# Patient Record
Sex: Female | Born: 1995 | Race: White | Hispanic: No | Marital: Single | Smoking: Former smoker
Health system: Southern US, Community
[De-identification: ages and names within clinical notes are randomized; demographics above are authoritative.]

## PROBLEM LIST (undated history)

## (undated) DIAGNOSIS — F1911 Other psychoactive substance abuse, in remission: Secondary | ICD-10-CM

## (undated) DIAGNOSIS — F319 Bipolar disorder, unspecified: Secondary | ICD-10-CM

## (undated) DIAGNOSIS — D497 Neoplasm of unspecified behavior of endocrine glands and other parts of nervous system: Secondary | ICD-10-CM

## (undated) DIAGNOSIS — G931 Anoxic brain damage, not elsewhere classified: Secondary | ICD-10-CM

## (undated) DIAGNOSIS — F419 Anxiety disorder, unspecified: Secondary | ICD-10-CM

## (undated) DIAGNOSIS — R569 Unspecified convulsions: Secondary | ICD-10-CM

## (undated) DIAGNOSIS — F32A Depression, unspecified: Secondary | ICD-10-CM

## (undated) DIAGNOSIS — F329 Major depressive disorder, single episode, unspecified: Secondary | ICD-10-CM

## (undated) HISTORY — DX: Other psychoactive substance abuse, in remission: F19.11

## (undated) HISTORY — DX: Anxiety disorder, unspecified: F41.9

## (undated) HISTORY — PX: TONSILLECTOMY: SUR1361

---
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
## 2016-02-06 DIAGNOSIS — E229 Hyperfunction of pituitary gland, unspecified: Secondary | ICD-10-CM | POA: Insufficient documentation

## 2016-05-21 DIAGNOSIS — F603 Borderline personality disorder: Secondary | ICD-10-CM | POA: Insufficient documentation

## 2016-05-21 DIAGNOSIS — F0670 Mild neurocognitive disorder due to known physiological condition without behavioral disturbance: Secondary | ICD-10-CM | POA: Insufficient documentation

## 2016-12-14 ENCOUNTER — Emergency Department (EMERGENCY_DEPARTMENT_HOSPITAL)
Admission: EM | Admit: 2016-12-14 | Discharge: 2016-12-15 | Disposition: A | Discharge status: Other Acute Inpatient Hospital | Payer: No Typology Code available for payment source | Source: Home / Self Care | Attending: Emergency Medicine | Admitting: Emergency Medicine

## 2016-12-14 ENCOUNTER — Encounter (HOSPITAL_COMMUNITY): Payer: Self-pay | Admitting: Emergency Medicine

## 2016-12-14 ENCOUNTER — Other Ambulatory Visit: Payer: Self-pay

## 2016-12-14 ENCOUNTER — Emergency Department (HOSPITAL_COMMUNITY): Payer: No Typology Code available for payment source

## 2016-12-14 ENCOUNTER — Encounter (HOSPITAL_COMMUNITY): Payer: Self-pay

## 2016-12-14 ENCOUNTER — Emergency Department (HOSPITAL_COMMUNITY)
Admission: EM | Admit: 2016-12-14 | Discharge: 2016-12-14 | Disposition: A | Discharge status: Home / Self Care | Payer: No Typology Code available for payment source | Attending: Physician Assistant | Admitting: Physician Assistant

## 2016-12-14 DIAGNOSIS — F3181 Bipolar II disorder: Secondary | ICD-10-CM | POA: Diagnosis not present

## 2016-12-14 DIAGNOSIS — F488 Other specified nonpsychotic mental disorders: Secondary | ICD-10-CM | POA: Insufficient documentation

## 2016-12-14 DIAGNOSIS — Z79899 Other long term (current) drug therapy: Secondary | ICD-10-CM | POA: Insufficient documentation

## 2016-12-14 DIAGNOSIS — R4587 Impulsiveness: Secondary | ICD-10-CM | POA: Diagnosis not present

## 2016-12-14 DIAGNOSIS — F603 Borderline personality disorder: Secondary | ICD-10-CM | POA: Diagnosis not present

## 2016-12-14 DIAGNOSIS — F1220 Cannabis dependence, uncomplicated: Secondary | ICD-10-CM | POA: Diagnosis not present

## 2016-12-14 DIAGNOSIS — R55 Syncope and collapse: Secondary | ICD-10-CM | POA: Diagnosis present

## 2016-12-14 HISTORY — DX: Neoplasm of unspecified behavior of endocrine glands and other parts of nervous system: D49.7

## 2016-12-14 HISTORY — DX: Anoxic brain damage, not elsewhere classified: G93.1

## 2016-12-14 LAB — CBC WITH DIFFERENTIAL/PLATELET
Basophils Absolute: 0 10*3/uL (ref 0.0–0.1)
Basophils Relative: 0 %
Eosinophils Absolute: 0.4 10*3/uL (ref 0.0–0.7)
Eosinophils Relative: 6 %
HEMATOCRIT: 39 % (ref 36.0–46.0)
HEMOGLOBIN: 13.5 g/dL (ref 12.0–15.0)
LYMPHS ABS: 3.3 10*3/uL (ref 0.7–4.0)
Lymphocytes Relative: 41 %
MCH: 31.5 pg (ref 26.0–34.0)
MCHC: 34.6 g/dL (ref 30.0–36.0)
MCV: 90.9 fL (ref 78.0–100.0)
MONO ABS: 0.5 10*3/uL (ref 0.1–1.0)
MONOS PCT: 6 %
NEUTROS ABS: 3.8 10*3/uL (ref 1.7–7.7)
NEUTROS PCT: 47 %
Platelets: 223 10*3/uL (ref 150–400)
RBC: 4.29 MIL/uL (ref 3.87–5.11)
RDW: 12.8 % (ref 11.5–15.5)
WBC: 8 10*3/uL (ref 4.0–10.5)

## 2016-12-14 LAB — I-STAT BETA HCG BLOOD, ED (MC, WL, AP ONLY): I-stat hCG, quantitative: <5 m[IU]/mL (ref <5)

## 2016-12-14 LAB — COMPREHENSIVE METABOLIC PANEL
ALBUMIN: 3.8 g/dL (ref 3.5–5.0)
ALK PHOS: 58 U/L (ref 38–126)
ALT: 11 U/L — AB (ref 14–54)
AST: 21 U/L (ref 15–41)
Anion gap: 6 (ref 5–15)
BUN: 10 mg/dL (ref 6–20)
CALCIUM: 8.7 mg/dL — AB (ref 8.9–10.3)
CO2: 21 mmol/L — ABNORMAL LOW (ref 22–32)
CREATININE: 0.83 mg/dL (ref 0.44–1.00)
Chloride: 113 mmol/L — ABNORMAL HIGH (ref 101–111)
GFR calc Af Amer: >60 mL/min (ref >60)
GFR calc non Af Amer: >60 mL/min (ref >60)
GLUCOSE: 60 mg/dL — AB (ref 65–99)
Potassium: 3.9 mmol/L (ref 3.5–5.1)
Sodium: 140 mmol/L (ref 135–145)
Total Bilirubin: 0.3 mg/dL (ref 0.3–1.2)
Total Protein: 6.2 g/dL — ABNORMAL LOW (ref 6.5–8.1)

## 2016-12-14 LAB — CBG MONITORING, ED
GLUCOSE-CAPILLARY: 56 mg/dL — AB (ref 65–99)
GLUCOSE-CAPILLARY: 161 mg/dL — AB (ref 65–99)

## 2016-12-14 LAB — URINALYSIS, ROUTINE W REFLEX MICROSCOPIC
BILIRUBIN URINE: NEGATIVE
BILIRUBIN URINE: NEGATIVE
GLUCOSE, UA: NEGATIVE mg/dL
GLUCOSE, UA: NEGATIVE mg/dL
HGB URINE DIPSTICK: NEGATIVE
Hgb urine dipstick: NEGATIVE
Ketones, ur: NEGATIVE mg/dL
Ketones, ur: NEGATIVE mg/dL
Leukocytes, UA: NEGATIVE
Leukocytes, UA: NEGATIVE
Nitrite: NEGATIVE
Nitrite: NEGATIVE
PH: 6 (ref 5.0–8.0)
Protein, ur: NEGATIVE mg/dL
Protein, ur: NEGATIVE mg/dL
SPECIFIC GRAVITY, URINE: 1.008 (ref 1.005–1.030)
SPECIFIC GRAVITY, URINE: 1.004 — AB (ref 1.005–1.030)
pH: 6 (ref 5.0–8.0)

## 2016-12-14 LAB — RAPID URINE DRUG SCREEN, HOSP PERFORMED
AMPHETAMINES: NOT DETECTED
BARBITURATES: NOT DETECTED
BENZODIAZEPINES: NOT DETECTED
COCAINE: NOT DETECTED
Opiates: NOT DETECTED
TETRAHYDROCANNABINOL: POSITIVE — AB

## 2016-12-14 LAB — CBC
HCT: 39.8 % (ref 36.0–46.0)
Hemoglobin: 13.3 g/dL (ref 12.0–15.0)
MCH: 30.4 pg (ref 26.0–34.0)
MCHC: 33.4 g/dL (ref 30.0–36.0)
MCV: 91.1 fL (ref 78.0–100.0)
PLATELETS: 224 10*3/uL (ref 150–400)
RBC: 4.37 MIL/uL (ref 3.87–5.11)
RDW: 12.7 % (ref 11.5–15.5)
WBC: 9.8 10*3/uL (ref 4.0–10.5)

## 2016-12-14 LAB — ETHANOL: Alcohol, Ethyl (B): <10 mg/dL (ref <10)

## 2016-12-14 MED ORDER — LORAZEPAM 0.5 MG PO TABS
0.5000 mg | ORAL_TABLET | Freq: Four times a day (QID) | ORAL | Status: DC | PRN
Start: 2016-12-14 — End: 2016-12-14

## 2016-12-14 MED ORDER — LORAZEPAM 1 MG PO TABS: 0.5000 mg | ORAL_TABLET | Freq: Four times a day (QID) | ORAL | 0 refills | Status: DC | PRN

## 2016-12-14 MED ORDER — HYDROXYZINE HCL 25 MG PO TABS
25.0000 mg | ORAL_TABLET | Freq: Once | ORAL | Status: AC
  Administered 2016-12-14: 25 mg via ORAL
  Filled 2016-12-14: qty 1

## 2016-12-14 MED ORDER — CARIPRAZINE HCL 1.5 MG PO CAPS
4.5000 mg | ORAL_CAPSULE | Freq: Every day | ORAL | Status: DC
  Filled 2016-12-14: qty 3

## 2016-12-14 MED ORDER — ACETAMINOPHEN 325 MG PO TABS: 650.0000 mg | ORAL_TABLET | Freq: Four times a day (QID) | ORAL | Status: DC | PRN

## 2016-12-14 MED ORDER — LORAZEPAM 2 MG/ML IJ SOLN
1.0000 mg | Freq: Once | INTRAMUSCULAR | Status: AC
  Administered 2016-12-14: 1 mg via INTRAMUSCULAR
  Filled 2016-12-14: qty 1

## 2016-12-14 MED ORDER — SODIUM CHLORIDE 0.9 % IV BOLUS (SEPSIS)
1000.0000 mL | Freq: Once | INTRAVENOUS | Status: AC
  Administered 2016-12-14: 1000 mL via INTRAVENOUS

## 2016-12-14 MED ORDER — GABAPENTIN 100 MG PO CAPS: 200.0000 mg | ORAL_CAPSULE | Freq: Three times a day (TID) | ORAL | Status: DC | PRN

## 2016-12-14 MED ORDER — LORAZEPAM 1 MG PO TABS
1.0000 mg | ORAL_TABLET | Freq: Four times a day (QID) | ORAL | Status: DC | PRN
  Administered 2016-12-15: 1 mg via ORAL
  Filled 2016-12-14: qty 1
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
## 2016-12-14 NOTE — ED Notes (Signed)
Pt's friend states that she told her today that she was going to kill herself today   Pt continues to deny that she is suicidal  IVC paperwork in progress Pt yelling she does not need to be here and has not done anything wrong  Pt changed into scrubs but refuses to take out her piercings

## 2016-12-14 NOTE — ED Notes (Signed)
Pt. Transferred to [facility] from ED to room 34 after screening for contraband. Report to include Situation, Background, Assessment and Recommendations from [facility]. Pt. Oriented to unit including Q15 minute rounds as well as the security cameras for their protection. Patient is alert and oriented, warm and dry in no acute distress. Patient denies SI, HI, and AVH. Pt. Encouraged to let me know if needs arise.

## 2016-12-14 NOTE — ED Notes (Signed)
Mother to my desk stating that pt was having audible hallucinations earlier today  Mother requesting no changes made with her medications unless they speak with her psych doctor, Dr Dorann Ou, at [facility] in [place]

## 2016-12-14 NOTE — ED Notes (Signed)
Patient transported to CT 

## 2016-12-14 NOTE — ED Notes (Signed)
Pt. Requesting sleep med.

## 2016-12-14 NOTE — ED Notes (Signed)
TTS at bedside. 

## 2016-12-14 NOTE — ED Notes (Signed)
ED Provider at bedside. 

## 2016-12-14 NOTE — ED Provider Notes (Signed)
[facility] EMERGENCY DEPARTMENT Provider Note   CSN: [id number] Arrival date & time: 12/14/16  1549     History   Chief Complaint Chief Complaint  Patient presents with  . Loss of Consciousness    HPI Jenna Pratt is a 21 y.o. female.  HPI  Patient is a 21 year old female past medical history significant for depression, bipolar, pseudoseizures, and "a bunch of vasovagal's".  Patient is here with having several episodes of syncope and presyncope in the last couple days.  Patient reports yesterday at a restaurant she felt ill went to the bathroom and then passed out next the toilet.  Then today when she was leaving a restaurant she felt her vision go black and she was able to grab something and lower herself to theground.  Then on the car ride over she felt like she passed out for period of 2 minutes.  No unexplained early deaths in family members.  Patient has no symptoms such as shortness of breath, chest pain.    Past Medical History:  Diagnosis Date  . Anoxic brain injury ([place])    from previous OD   . Bipolar 1 disorder ([place])   . Depression   . Pituitary tumor   . Seizures ([place])     There are no active problems to display for this patient.   Past Surgical History:  Procedure Laterality Date  . TONSILLECTOMY      OB History    No data available       Home Medications    Prior to Admission medications   Medication Sig Start Date End Date Taking? Authorizing Provider  dicyclomine (BENTYL) 20 MG tablet Take 1 tablet (20 mg total) by mouth 3 (three) times daily as needed for spasms. 10/28/14   Daymon Larsen, MD    Family History History reviewed. No pertinent family history.  Social History Social History   Tobacco Use  . Smoking status: Never Smoker  . Smokeless tobacco: Never Used  Substance Use Topics  . Alcohol use: No    Frequency: Never  . Drug use: Yes    Types: Marijuana    Comment: Benzos     Allergies     Prozac [fluoxetine hcl]   Review of Systems Review of Systems  Constitutional: Positive for fatigue. Negative for activity change and fever.  Respiratory: Negative for shortness of breath.   Cardiovascular: Negative for chest pain.  Gastrointestinal: Negative for abdominal pain.  Neurological: Positive for syncope.     Physical Exam Updated Vital Signs BP 114/74   Pulse (!) 105   Temp 98.4 F (36.9 C) (Oral)   Resp 14   SpO2 100%   Physical Exam  Constitutional: She is oriented to person, place, and time. She appears well-developed and well-nourished.  HENT:  Head: Normocephalic and atraumatic.  Choker   Eyes: Right eye exhibits no discharge. Left eye exhibits no discharge.  Cardiovascular: Normal rate, regular rhythm and normal heart sounds.  No murmur heard. Pulmonary/Chest: Effort normal and breath sounds normal. She has no wheezes. She has no rales.  Abdominal: Soft. She exhibits no distension. There is no tenderness.  Neurological: She is oriented to person, place, and time.  Skin: Skin is warm and dry. She is not diaphoretic.  Psychiatric: She has a normal mood and affect.  Nursing note and vitals reviewed.    ED Treatments / Results  Labs (all labs ordered are listed, but only abnormal results are displayed) Labs Reviewed  CBG MONITORING, ED - Abnormal; Notable for the following components:      Result Value   Glucose-Capillary 56 (*)    All other components within normal limits  BASIC METABOLIC PANEL  CBC  URINALYSIS, ROUTINE W REFLEX MICROSCOPIC  COMPREHENSIVE METABOLIC PANEL  I-STAT BETA HCG BLOOD, ED ([facility], [facility], [facility] ONLY)  CBG MONITORING, ED    EKG  EKG Interpretation  Date/Time:  Sunday December 14 2016 16:00:30 EST Ventricular Rate:  101 PR Interval:  130 QRS Duration: 72 QT Interval:  342 QTC Calculation: 443 R Axis:   66 Text Interpretation:  Sinus tachycardia Otherwise normal ECG Sinus tachycardia Confirmed by Thomasene Lot, [place] ([id number])  on 12/14/2016 4:30:51 PM       Radiology No results found.  Procedures Procedures (including critical care time)  Medications Ordered in ED Medications  sodium chloride 0.9 % bolus 1,000 mL (not administered)     Initial Impression / Assessment and Plan / ED Course  I have reviewed the triage vital signs and the nursing notes.  Pertinent labs & imaging results that were available during my care of the patient were reviewed by me and considered in my medical decision making (see chart for details).    Patient is a 21 year old female past medical history significant for depression, bipolar, pseudoseizures, and "a bunch of vasovagal's".  Patient is here with having several episodes of syncope and presyncope in the last couple days.  Patient reports yesterday at a restaurant she felt ill went to the bathroom and then passed out next the toilet.  Then today when she was leaving a restaurant she felt her vision go black and she was able to grab something and lower herself to theground.  Then on the car ride over she felt like she passed out for period of 2 minutes.  No unexplained early deaths in family members.  Patient has no symptoms such as shortness of breath, chest pain.  4:43 PM Nothing about these episodes sounds cardiac in nature.  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  Patient of note is wearing a choker that is Nurse, mental health.  We recommend that she take it off in case of his vaso-occlusive.  She reports increased anxiety before each of these attacks.  We will give fluids, check baseline labs and imaging and likely be able to discharge home after ambulation.   Final Clinical Impressions(s) / ED Diagnoses   Final diagnoses:  None    ED Discharge Orders    None       Macarthur Critchley, MD 12/14/16 2202

## 2016-12-14 NOTE — ED Notes (Signed)
Pt given orange juice on the way back to treatment room

## 2016-12-14 NOTE — ED Triage Notes (Signed)
Onset yesterday LOC x 1, today x 3, last LOC 10 minutes ago.    Pt feels tired since first LOC.  No recent illness.

## 2016-12-14 NOTE — ED Provider Notes (Signed)
[place] DEPT Provider Note   CSN: [id number] Arrival date & time: 12/14/16  2208     History   Chief Complaint Chief Complaint  Patient presents with  . Suicidal    HPI LORANE COUSAR is a 21 y.o. female history of anoxic brain injury, bipolar, depression here presenting with suicidal ideations.  Patient was seen in the ED at home several hours ago for syncope and was thought to be pseudoseizures secondary to anxiety and depression.  She went home and apparently told her friend that she wanted to kill herself.  She has been depressed for many months and was in fact admitted to [facility] psychiatric center several months ago after an overdose.  He apparently called mother and became very physically aggressive with her and police was called and she was brought here for evaluation.  Patient adamantly denies any suicidal ideations to me but per the mother, patient always does that when she tries to avoid admissions.  While in the ED, patient became agitated and started to leave.   The history is provided by the patient and a parent.    Past Medical History:  Diagnosis Date  . Anoxic brain injury ([place])    from previous OD   . Bipolar 1 disorder ([place])   . Depression   . Pituitary tumor   . Seizures ([place])     There are no active problems to display for this patient.   Past Surgical History:  Procedure Laterality Date  . TONSILLECTOMY      OB History    No data available       Home Medications    Prior to Admission medications   Medication Sig Start Date End Date Taking? Authorizing Provider  Cariprazine HCl (VRAYLAR) 4.5 MG CAPS Take 4.5 mg at bedtime by mouth.     [provider]  dicyclomine (BENTYL) 20 MG tablet Take 1 tablet (20 mg total) by mouth 3 (three) times daily as needed for spasms. Patient not taking: Reported on 12/14/2016 10/28/14   Daymon Larsen, MD  ferrous sulfate 325 (65 FE) MG tablet Take 325 mg daily  by mouth.    [provider]  FOLIC ACID PO Take 1 tablet daily by mouth.    [provider]  gabapentin (NEURONTIN) 100 MG capsule Take 200 mg 3 (three) times daily as needed by mouth (anxiety/panic attacks).    [provider]  LORazepam (ATIVAN) 1 MG tablet Take 0.5 tablets (0.5 mg total) every 6 (six) hours as needed by mouth for anxiety. 12/14/16   Mackuen, Courteney Lyn, MD  venlafaxine (EFFEXOR) 25 MG tablet Take 25 mg daily by mouth.    [provider]    Family History History reviewed. No pertinent family history.  Social History Social History   Tobacco Use  . Smoking status: Never Smoker  . Smokeless tobacco: Never Used  Substance Use Topics  . Alcohol use: No    Frequency: Never  . Drug use: No    Comment: denies      Allergies   Prozac [fluoxetine hcl]   Review of Systems Review of Systems  Psychiatric/Behavioral: Positive for decreased concentration and suicidal ideas.  All other systems reviewed and are negative.    Physical Exam Updated Vital Signs BP 102/85 (BP Location: Left Arm)   Pulse (!) 102   Temp 98 F (36.7 C) (Oral)   Resp 18   SpO2 99%   Physical Exam  Constitutional: She  is oriented to person, place, and time.  Depressed, avoiding eye contact   HENT:  Head: Normocephalic.  Mouth/Throat: Oropharynx is clear and moist.  Eyes: Conjunctivae and EOM are normal. Pupils are equal, round, and reactive to light.  Neck: Normal range of motion. Neck supple.  Cardiovascular: Normal rate, regular rhythm and normal heart sounds.  Pulmonary/Chest: Effort normal and breath sounds normal. No stridor. No respiratory distress. She has no wheezes.  Abdominal: Soft. Bowel sounds are normal. She exhibits no distension. There is no tenderness. There is no guarding.  Musculoskeletal: Normal range of motion.  Neurological: She is alert and oriented to person, place, and time. She displays normal reflexes. No cranial nerve  deficit. Coordination normal.  Skin: Skin is warm.  Psychiatric:  Depressed, avoiding eye contact   Nursing note and vitals reviewed.    ED Treatments / Results  Labs (all labs ordered are listed, but only abnormal results are displayed) Labs Reviewed  CBC WITH DIFFERENTIAL/PLATELET  COMPREHENSIVE METABOLIC PANEL  ETHANOL  URINALYSIS, ROUTINE W REFLEX MICROSCOPIC  RAPID URINE DRUG SCREEN, HOSP PERFORMED  I-STAT BETA HCG BLOOD, ED ([facility], [facility], [facility] ONLY)    EKG  EKG Interpretation None       Radiology Dg Chest 2 View  Result Date: 12/14/2016 CLINICAL DATA:  Patient reports syncope for a few days. EXAM: CHEST  2 VIEW COMPARISON:  None. FINDINGS: The heart size and mediastinal contours are within normal limits. Both lungs are clear. The visualized skeletal structures are unremarkable. IMPRESSION: No active cardiopulmonary disease. Electronically Signed   By: Dorise Bullion III M.D   On: 12/14/2016 17:47   Ct Head Wo Contrast  Result Date: 12/14/2016 CLINICAL DATA:  Altered level of consciousness EXAM: CT HEAD WITHOUT CONTRAST TECHNIQUE: Contiguous axial images were obtained from the base of the skull through the vertex without intravenous contrast. COMPARISON:  None. FINDINGS: Brain: No evidence of acute infarction, hemorrhage, hydrocephalus, extra-axial collection or mass lesion/mass effect. Vascular: No hyperdense vessel or unexpected calcification. Skull: Normal. Negative for fracture or focal lesion. Sinuses/Orbits: No acute finding. Other: None. IMPRESSION: No acute abnormality noted. Electronically Signed   By: Inez Catalina M.D.   On: 12/14/2016 17:32    Procedures Procedures (including critical care time)  Medications Ordered in ED Medications  LORazepam (ATIVAN) injection 1 mg (1 mg Intramuscular Given 12/14/16 2309)     Initial Impression / Assessment and Plan / ED Course  I have reviewed the triage vital signs and the nursing notes.  Pertinent labs & imaging  results that were available during my care of the patient were reviewed by me and considered in my medical decision making (see chart for details).     Starlena Beil Dowdy is a 21 y.o. female here with depression, suicidal ideation. She was seen at [facility] several hours ago for syncope and was thought to have pseudoseizure or anxiety. She has been depressed and told friend and mother that she is suicidal. She doesn't want to stay for evaluation so IVC paperwork was filled out. Labs from earlier today reviewed and she denies any drug use since the ED visit. She is medically cleared. Will add ETOH, UDS. Will consult TTS.    Final Clinical Impressions(s) / ED Diagnoses   Final diagnoses:  None    ED Discharge Orders    None       Drenda Freeze, MD 12/14/16 2321

## 2016-12-14 NOTE — ED Notes (Signed)
Patient transported to X-ray 

## 2016-12-14 NOTE — ED Notes (Signed)
Bed: [facility]PT4 Expected date:  Expected time:  Means of arrival:  Comments: 

## 2016-12-14 NOTE — ED Triage Notes (Signed)
Pt brought in by sheriff from home  Mother called police to do a wellness check on her daughter  Pt states she had a syncopal episode today due to stress  Pt states she is fine  Mother states pt has a hx of depression and suicide attempts  Pt was in a coma due to an overdose in December and has had a couple attempts since then  Pt's brother died around this time of year  Pt denies suicidal ideations at this time  Pt came with sheriff voluntarily  States she came to avoid any altercations  Mother states she will IVC her if needed

## 2016-12-14 NOTE — BH Assessment (Signed)
[facility] Assessment Progress Note  Per Lindon Romp, NP pt meets criteria for inpt treatment. EDP Drenda Freeze, MD notified and in agreement with disposition. [facility] bed availability pending per [place]. Pt's nurse aware of recommendation.   Lind Covert, MSW, LCSW Therapeutic Triage Specialist  [phone]

## 2016-12-14 NOTE — Discharge Instructions (Signed)
We are glad you are feeling better.  Your CT, chest x-ray, labs are all reassuring.  Please follow-up with your primary care physician, you  may require Holter monitor or other evaluation.  To find a primary care or specialty doctor please call [phone] or [phone] to access "[place] a Doctor Service."  You may also go on the [facility] website at [email or web address]  There are also multiple [facility] practices throughout the [place] that are frequently accepting new patients. You may find a clinic that is close to your home and contact them.  [facility] -  [address] [place] [address] [phone]  [facility] in [place] (also locations in [place] and [place]) -  [place] [address]  [place] [address] [phone]

## 2016-12-15 ENCOUNTER — Other Ambulatory Visit: Payer: Self-pay

## 2016-12-15 ENCOUNTER — Inpatient Hospital Stay (HOSPITAL_COMMUNITY)
Admission: AD | Admit: 2016-12-15 | Discharge: 2016-12-20 | DRG: 885 | Disposition: A | Discharge status: Home / Self Care | Payer: No Typology Code available for payment source | Attending: Psychiatry | Admitting: Psychiatry

## 2016-12-15 ENCOUNTER — Encounter (HOSPITAL_COMMUNITY): Payer: Self-pay | Admitting: *Deleted

## 2016-12-15 DIAGNOSIS — R4587 Impulsiveness: Secondary | ICD-10-CM | POA: Diagnosis not present

## 2016-12-15 DIAGNOSIS — F419 Anxiety disorder, unspecified: Secondary | ICD-10-CM | POA: Diagnosis not present

## 2016-12-15 DIAGNOSIS — G931 Anoxic brain damage, not elsewhere classified: Secondary | ICD-10-CM | POA: Diagnosis present

## 2016-12-15 DIAGNOSIS — R569 Unspecified convulsions: Secondary | ICD-10-CM | POA: Diagnosis present

## 2016-12-15 DIAGNOSIS — F603 Borderline personality disorder: Secondary | ICD-10-CM | POA: Diagnosis not present

## 2016-12-15 DIAGNOSIS — F411 Generalized anxiety disorder: Secondary | ICD-10-CM | POA: Diagnosis present

## 2016-12-15 DIAGNOSIS — T6592XS Toxic effect of unspecified substance, intentional self-harm, sequela: Secondary | ICD-10-CM

## 2016-12-15 DIAGNOSIS — F3181 Bipolar II disorder: Secondary | ICD-10-CM | POA: Diagnosis present

## 2016-12-15 DIAGNOSIS — G4700 Insomnia, unspecified: Secondary | ICD-10-CM | POA: Diagnosis not present

## 2016-12-15 DIAGNOSIS — R450 Nervousness: Secondary | ICD-10-CM | POA: Diagnosis not present

## 2016-12-15 DIAGNOSIS — F488 Other specified nonpsychotic mental disorders: Secondary | ICD-10-CM | POA: Diagnosis not present

## 2016-12-15 DIAGNOSIS — F17210 Nicotine dependence, cigarettes, uncomplicated: Secondary | ICD-10-CM | POA: Diagnosis present

## 2016-12-15 DIAGNOSIS — Z818 Family history of other mental and behavioral disorders: Secondary | ICD-10-CM | POA: Diagnosis not present

## 2016-12-15 DIAGNOSIS — R45851 Suicidal ideations: Secondary | ICD-10-CM | POA: Diagnosis present

## 2016-12-15 DIAGNOSIS — F39 Unspecified mood [affective] disorder: Secondary | ICD-10-CM | POA: Diagnosis not present

## 2016-12-15 DIAGNOSIS — F1220 Cannabis dependence, uncomplicated: Secondary | ICD-10-CM

## 2016-12-15 LAB — COMPREHENSIVE METABOLIC PANEL
ALBUMIN: 4.2 g/dL (ref 3.5–5.0)
ALT: 10 U/L — ABNORMAL LOW (ref 14–54)
AST: 18 U/L (ref 15–41)
Alkaline Phosphatase: 52 U/L (ref 38–126)
Anion gap: 6 (ref 5–15)
BUN: 10 mg/dL (ref 6–20)
CHLORIDE: 111 mmol/L (ref 101–111)
CO2: 23 mmol/L (ref 22–32)
Calcium: 8.8 mg/dL — ABNORMAL LOW (ref 8.9–10.3)
Creatinine, Ser: 0.71 mg/dL (ref 0.44–1.00)
GFR calc Af Amer: >60 mL/min (ref >60)
GFR calc non Af Amer: >60 mL/min (ref >60)
GLUCOSE: 101 mg/dL — AB (ref 65–99)
POTASSIUM: 3.4 mmol/L — AB (ref 3.5–5.1)
Sodium: 140 mmol/L (ref 135–145)
Total Bilirubin: 0.5 mg/dL (ref 0.3–1.2)
Total Protein: 6.7 g/dL (ref 6.5–8.1)

## 2016-12-15 MED ORDER — HYDROXYZINE HCL 25 MG PO TABS
25.0000 mg | ORAL_TABLET | Freq: Three times a day (TID) | ORAL | Status: DC
Start: 2016-12-15 — End: 2016-12-15

## 2016-12-15 MED ORDER — MAGNESIUM HYDROXIDE 400 MG/5ML PO SUSP: 30.0000 mL | Freq: Every day | ORAL | Status: DC | PRN

## 2016-12-15 MED ORDER — CARIPRAZINE HCL 1.5 MG PO CAPS
4.5000 mg | ORAL_CAPSULE | Freq: Every day | ORAL | Status: DC
  Administered 2016-12-15 – 2016-12-19 (×4): 4.5 mg via ORAL
  Filled 2016-12-15 (×7): qty 3

## 2016-12-15 MED ORDER — HYDROXYZINE HCL 25 MG PO TABS: 25.0000 mg | ORAL_TABLET | Freq: Three times a day (TID) | ORAL | Status: DC

## 2016-12-15 MED ORDER — GABAPENTIN 100 MG PO CAPS
200.0000 mg | ORAL_CAPSULE | Freq: Two times a day (BID) | ORAL | Status: DC
  Administered 2016-12-15: 200 mg via ORAL
  Filled 2016-12-15: qty 2

## 2016-12-15 MED ORDER — ACETAMINOPHEN 325 MG PO TABS: 650.0000 mg | ORAL_TABLET | Freq: Four times a day (QID) | ORAL | Status: DC | PRN

## 2016-12-15 MED ORDER — TRAZODONE HCL 50 MG PO TABS
50.0000 mg | ORAL_TABLET | Freq: Every evening | ORAL | Status: DC | PRN
  Administered 2016-12-15 – 2016-12-19 (×4): 50 mg via ORAL
  Filled 2016-12-15 (×4): qty 1

## 2016-12-15 MED ORDER — LORAZEPAM 1 MG PO TABS
1.0000 mg | ORAL_TABLET | Freq: Four times a day (QID) | ORAL | Status: DC | PRN
  Administered 2016-12-15 – 2016-12-17 (×2): 1 mg via ORAL
  Filled 2016-12-15 (×3): qty 1

## 2016-12-15 MED ORDER — HYDROXYZINE HCL 25 MG PO TABS
25.0000 mg | ORAL_TABLET | Freq: Three times a day (TID) | ORAL | Status: DC
  Administered 2016-12-15 – 2016-12-20 (×15): 25 mg via ORAL
  Filled 2016-12-15 (×23): qty 1

## 2016-12-15 MED ORDER — GABAPENTIN 100 MG PO CAPS
200.0000 mg | ORAL_CAPSULE | Freq: Two times a day (BID) | ORAL | Status: DC
  Administered 2016-12-15 – 2016-12-16 (×2): 200 mg via ORAL
  Filled 2016-12-15 (×7): qty 2

## 2016-12-15 MED ORDER — ALUM & MAG HYDROXIDE-SIMETH 200-200-20 MG/5ML PO SUSP: 30.0000 mL | ORAL | Status: DC | PRN

## 2016-12-15 NOTE — Plan of Care (Signed)
Pt remains a low fall risk, denies SI at this time.

## 2016-12-15 NOTE — Consult Note (Signed)
[facility] Psychiatry Consult   Reason for Consult:  Depression and suicidal thoughts Referring Physician:  EDP Patient Identification: Jenna Pratt MRN:  [id number] Principal Diagnosis: Bipolar 2 disorder, major depressive episode [facility]) Diagnosis:   Patient Active Problem List   Diagnosis Date Noted  . Bipolar 2 disorder, major depressive episode ([place]) [F31.81] 12/15/2016    Priority: High  . Cannabis use disorder, moderate, dependence ([place]) [F12.20] 12/15/2016    Total Time spent with patient: 45 minutes  Subjective:   Jenna Pratt is a 21 y.o. female patient admitted with suicidal thoughts.  HPI:  Patient with history of Bipolar depression, Anxiety, Borderline personality disorder and Cannabis use disorder. Patient presents with worsening depression and recurrent suicidal thoughts. Patient reports being under a lot of stress, her brother dies around this time a year ago. Patient's mother took out IVC paper on her after patient apparently told her friend that she wanted to kill herself. She was combative and aggressive with the police prior to presentation at the ED. Patient has history of suicide attempt by OD.   Past Psychiatric History: as above  Risk to Self: Suicidal Ideation: Yes-Currently Present Suicidal Intent: No Is patient at risk for suicide?: Yes Suicidal Plan?: No Access to Means: No What has been your use of drugs/alcohol within the last 12 months?: denies use, labs are positive for cannabis on arrival to ED  How many times?: 1 Triggers for Past Attempts: Family contact Intentional Self Injurious Behavior: None Risk to Others: Homicidal Ideation: No Thoughts of Harm to Others: No Current Homicidal Intent: No Current Homicidal Plan: No Access to Homicidal Means: No History of harm to others?: No Assessment of Violence: None Noted Does patient have access to weapons?: No Criminal Charges Pending?: No Does patient have a court date: No Prior  Inpatient Therapy: Prior Inpatient Therapy: Yes Prior Therapy Dates: 2017 Prior Therapy Facilty/Provider(s): [facility] Reason for Treatment: attempted OD Prior Outpatient Therapy: Prior Outpatient Therapy: Yes Prior Therapy Dates: current Prior Therapy Facilty/Provider([facility]): [facility]  Reason for Treatment: med management  Does patient have an ACCT team?: No Does patient have Intensive In-House Services?  : No Does patient have [facility] services? : No Does patient have P4CC services?: No  Past Medical History:  Past Medical History:  Diagnosis Date  . Anoxic brain injury ([place])    from previous OD   . Bipolar 1 disorder ([place])   . Depression   . Pituitary tumor   . Seizures ([place])     Past Surgical History:  Procedure Laterality Date  . TONSILLECTOMY     Family History: History reviewed. No pertinent family history. Family Psychiatric  History:  Social History:  Social History   Substance and Sexual Activity  Alcohol Use No  . Frequency: Never     Social History   Substance and Sexual Activity  Drug Use No   Comment: denies     Social History   Socioeconomic History  . Marital status: Single    Spouse name: None  . Number of children: None  . Years of education: None  . Highest education level: None  Social Needs  . Financial resource strain: None  . Food insecurity - worry: None  . Food insecurity - inability: None  . Transportation needs - medical: None  . Transportation needs - non-medical: None  Occupational History  . None  Tobacco Use  . Smoking status: Never Smoker  . Smokeless tobacco: Never Used  Substance and Sexual Activity  .  Alcohol use: No    Frequency: Never  . Drug use: No    Comment: denies   . Sexual activity: Yes    Birth control/protection: None    Comment: pt is not sexually active with men  Other Topics Concern  . None  Social History Narrative  . None   Additional Social History:    Allergies:   Allergies  Allergen  Reactions  . Prozac [Fluoxetine Hcl] Anxiety    Labs:  Results for orders placed or performed during the hospital encounter of 12/14/16 (from the past 48 hour(s))  Urinalysis, Routine w reflex microscopic     Status: Abnormal   Collection Time: 12/14/16 10:50 PM  Result Value Ref Range   Color, Urine STRAW (A) YELLOW   APPearance CLEAR CLEAR   Specific Gravity, Urine 1.004 (L) 1.005 - 1.030   pH 6.0 5.0 - 8.0   Glucose, UA NEGATIVE NEGATIVE mg/dL   Hgb urine dipstick NEGATIVE NEGATIVE   Bilirubin Urine NEGATIVE NEGATIVE   Ketones, ur NEGATIVE NEGATIVE mg/dL   Protein, ur NEGATIVE NEGATIVE mg/dL   Nitrite NEGATIVE NEGATIVE   Leukocytes, UA NEGATIVE NEGATIVE  Rapid urine drug screen (hospital performed)     Status: Abnormal   Collection Time: 12/14/16 10:50 PM  Result Value Ref Range   Opiates NONE DETECTED NONE DETECTED   Cocaine NONE DETECTED NONE DETECTED   Benzodiazepines NONE DETECTED NONE DETECTED   Amphetamines NONE DETECTED NONE DETECTED   Tetrahydrocannabinol POSITIVE (A) NONE DETECTED   Barbiturates NONE DETECTED NONE DETECTED    Comment:        DRUG SCREEN FOR MEDICAL PURPOSES ONLY.  IF CONFIRMATION IS NEEDED FOR ANY PURPOSE, NOTIFY LAB WITHIN 5 DAYS.        LOWEST DETECTABLE LIMITS FOR URINE DRUG SCREEN Drug Class       Cutoff (ng/mL) Amphetamine      1000 Barbiturate      200 Benzodiazepine   637 Tricyclics       858 Opiates          300 Cocaine          300 THC              50   CBC with Differential/Platelet     Status: None   Collection Time: 12/14/16 11:01 PM  Result Value Ref Range   WBC 8.0 4.0 - 10.5 K/uL   RBC 4.29 3.87 - 5.11 MIL/uL   Hemoglobin 13.5 12.0 - 15.0 g/dL   HCT 39.0 36.0 - 46.0 %   MCV 90.9 78.0 - 100.0 fL   MCH 31.5 26.0 - 34.0 pg   MCHC 34.6 30.0 - 36.0 g/dL   RDW 12.8 11.5 - 15.5 %   Platelets 223 150 - 400 K/uL   Neutrophils Relative % 47 %   Neutro Abs 3.8 1.7 - 7.7 K/uL   Lymphocytes Relative 41 %   Lymphs Abs 3.3 0.7  - 4.0 K/uL   Monocytes Relative 6 %   Monocytes Absolute 0.5 0.1 - 1.0 K/uL   Eosinophils Relative 6 %   Eosinophils Absolute 0.4 0.0 - 0.7 K/uL   Basophils Relative 0 %   Basophils Absolute 0.0 0.0 - 0.1 K/uL  Comprehensive metabolic panel     Status: Abnormal   Collection Time: 12/14/16 11:01 PM  Result Value Ref Range   Sodium 140 135 - 145 mmol/L   Potassium 3.4 (L) 3.5 - 5.1 mmol/L   Chloride 111 101 - 111 mmol/L  CO2 23 22 - 32 mmol/L   Glucose, Bld 101 (H) 65 - 99 mg/dL   BUN 10 6 - 20 mg/dL   Creatinine, Ser 0.71 0.44 - 1.00 mg/dL   Calcium 8.8 (L) 8.9 - 10.3 mg/dL   Total Protein 6.7 6.5 - 8.1 g/dL   Albumin 4.2 3.5 - 5.0 g/dL   AST 18 15 - 41 U/L   ALT 10 (L) 14 - 54 U/L   Alkaline Phosphatase 52 38 - 126 U/L   Total Bilirubin 0.5 0.3 - 1.2 mg/dL   GFR calc non Af Amer >60 >60 mL/min   GFR calc Af Amer >60 >60 mL/min    Comment: (NOTE) The eGFR has been calculated using the CKD EPI equation. This calculation has not been validated in all clinical situations. eGFR's persistently <60 mL/min signify possible Chronic Kidney Disease.    Anion gap 6 5 - 15  Ethanol     Status: None   Collection Time: 12/14/16 11:01 PM  Result Value Ref Range   Alcohol, Ethyl (B) <10 <10 mg/dL    Comment:        LOWEST DETECTABLE LIMIT FOR SERUM ALCOHOL IS 10 mg/dL FOR MEDICAL PURPOSES ONLY   I-Stat Beta hCG blood, ED ([facility], [facility], [facility] only)     Status: None   Collection Time: 12/14/16 11:12 PM  Result Value Ref Range   I-stat hCG, quantitative <5.0 <5 mIU/mL   Comment 3            Comment:   GEST. AGE      CONC.  (mIU/mL)   <=1 WEEK        5 - 50     2 WEEKS       50 - 500     3 WEEKS       100 - 10,000     4 WEEKS     1,000 - 30,000        FEMALE AND NON-PREGNANT FEMALE:     LESS THAN 5 mIU/mL     Current Facility-Administered Medications  Medication Dose Route Frequency Provider Last Rate Last Dose  . acetaminophen (TYLENOL) tablet 650 mg  650 mg Oral Q6H PRN Drenda Freeze, MD      . Cariprazine HCl CAPS 4.5 mg  4.5 mg Oral QHS Drenda Freeze, MD      . gabapentin (NEURONTIN) capsule 200 mg  200 mg Oral TID PRN Drenda Freeze, MD      . LORazepam (ATIVAN) tablet 1 mg  1 mg Oral Q6H PRN Drenda Freeze, MD       Current Outpatient Medications  Medication Sig Dispense Refill  . Cariprazine HCl (VRAYLAR) 4.5 MG CAPS Take 4.5 mg at bedtime by mouth.     . dicyclomine (BENTYL) 20 MG tablet Take 1 tablet (20 mg total) by mouth 3 (three) times daily as needed for spasms. (Patient not taking: Reported on 12/14/2016) 30 tablet 0  . ferrous sulfate 325 (65 FE) MG tablet Take 325 mg daily by mouth.    . FOLIC ACID PO Take 1 tablet daily by mouth.    . gabapentin (NEURONTIN) 100 MG capsule Take 200 mg 3 (three) times daily as needed by mouth (anxiety/panic attacks).    . LORazepam (ATIVAN) 1 MG tablet Take 0.5 tablets (0.5 mg total) every 6 (six) hours as needed by mouth for anxiety. 3 tablet 0  . venlafaxine (EFFEXOR) 25 MG tablet Take 25 mg daily by  mouth.      Musculoskeletal: Strength & Muscle Tone: within normal limits Gait & Station: normal Patient leans: N/A  Psychiatric Specialty Exam: Physical Exam  Psychiatric: Her behavior is normal. Her affect is labile. Her speech is rapid and/or pressured. Cognition and memory are normal. She expresses impulsivity. She exhibits a depressed mood. She expresses suicidal ideation.    Review of Systems  Constitutional: Negative.   HENT: Negative.   Eyes: Negative.   Respiratory: Negative.   Cardiovascular: Negative.   Gastrointestinal: Negative.   Genitourinary: Negative.   Musculoskeletal: Negative.   Skin: Negative.   Neurological: Negative.   Endo/Heme/Allergies: Negative.   Psychiatric/Behavioral: Positive for depression and suicidal ideas. The patient is nervous/anxious and has insomnia.     Blood pressure (!) 102/56, pulse 72, temperature 98 F (36.7 C), temperature source Oral, resp. rate  16, SpO2 100 %.There is no height or weight on file to calculate BMI.  General Appearance: Casual  Eye Contact:  Minimal  Speech:  Pressured  Volume:  Decreased  Mood:  Depressed and Irritable  Affect:  Constricted  Thought Process:  Coherent  Orientation:  Full (Time, Place, and Person)  Thought Content:  Logical  Suicidal Thoughts:  Yes.  without intent/plan  Homicidal Thoughts:  No  Memory:  Immediate;   Good Recent;   Fair Remote;   Fair  Judgement:  Poor  Insight:  Shallow  Psychomotor Activity:  Psychomotor Retardation  Concentration:  Concentration: Fair and Attention Span: Fair  Recall:  [employer] of Knowledge:  Fair  Language:  Good  Akathisia:  No  Handed:  Right  AIMS (if indicated):     Assets:  Communication Skills Desire for Improvement Social Support  ADL's:  Intact  Cognition:  WNL  Sleep:   poor     Treatment Plan Summary: Daily contact with patient to assess and evaluate symptoms and progress in treatment and Medication management Continue Vraylar 4.5 mg Qhs, Reduce Gabapentin to 200 mg bid due to dizziness.  Disposition: Recommend psychiatric Inpatient admission when medically cleared.  Corena Pilgrim, MD 12/15/2016 11:23 AM

## 2016-12-15 NOTE — ED Notes (Signed)
Hourly rounding reveals patient sleeping in room. No complaints, stable, in no acute distress. Q15 minute rounds and monitoring via [employer] to continue. 

## 2016-12-15 NOTE — Progress Notes (Addendum)
  DATA ACTION RESPONSE  Objective- Pt. is visible in the hallway, seen interacting with visitor. Presents with an anxious/flat affect and mood. Pt states "I am having an anxiety attack right now". Pt was ask to go sit down and take deep breaths.Emotional support provided.    Subjective- Denies having any SI/HI/AVH/Pain at this time. Is cooperative and remain safe on the unit.  1:1 interaction in private to establish rapport. Encouragement, education, & support given from staff.  PRN Ativan and trazodone requested and will re-eval accordingly.   Safety maintained with Q 15 checks. Continue with POC.

## 2016-12-15 NOTE — Progress Notes (Signed)
Patient did not attend wrap up group tonight.

## 2016-12-15 NOTE — Progress Notes (Signed)
Jenna Pratt is 21 year old female pt admitted on involuntary basis. On admission, she endorses that she is here because she has been feeling suicidal. She denies currently and able to contract for safety while in the hospital. She reports that she has a psychiatrist and takes her medications as prescribed. She reports being hospitalized in the past for similar presentation. She reports financial and family stressors. She reports that she lives with her girlfriend and reports that she will go back there after discharge. She was oriented to the unit and safety maintained.

## 2016-12-15 NOTE — ED Notes (Signed)
Hourly rounding reveals patient in hall on phone. No complaints, stable, in no acute distress. Q15 minute rounds and monitoring via [employer] to continue.

## 2016-12-15 NOTE — ED Notes (Signed)
Pt discharged safely with [employer] .  Pt was calm and cooperative.  All belongings were returned to pt.

## 2016-12-15 NOTE — BH Assessment (Signed)
[place] Assessment Progress Note  Per Corena Pilgrim, MD, this pt requires psychiatric hospitalization.  Leonia Reader, RN, [facility] has assigned pt to [facility] Rm 405-2.  Pt presents under IVC initiated by EDP Shirlyn Goltz, MD, and IVC documents have been faxed to [facility].  Pt's nurse, Nena Jordan, has been notified, and agrees to call report to [phone].  Pt is to be transported via Event organiser.   Jalene Mullet, [place] Triage Specialist [phone]

## 2016-12-15 NOTE — ED Notes (Signed)
Pt is calm and cooperative.  Report called to [facility] and [employer] called for transport.  Pt denies S/I, H/I, and AVH.

## 2016-12-15 NOTE — Tx Team (Signed)
Initial Treatment Plan 12/15/2016 2:56 PM [name] Majka MGN:[id number]    PATIENT STRESSORS: Financial difficulties Marital or family conflict   PATIENT STRENGTHS: Ability for insight Average or above average intelligence Capable of independent living General fund of knowledge Motivation for treatment/growth   PATIENT IDENTIFIED PROBLEMS: Depression Suicidal thoughts "I want to work on my anxiety"                     DISCHARGE CRITERIA:  Ability to meet basic life and health needs Improved stabilization in mood, thinking, and/or behavior Reduction of life-threatening or endangering symptoms to within safe limits Verbal commitment to aftercare and medication compliance  PRELIMINARY DISCHARGE PLAN: Attend aftercare/continuing care group Return to previous living arrangement  PATIENT/FAMILY INVOLVEMENT: This treatment plan has been presented to and reviewed with the patient, Jenna Pratt, and/or family member, .  The patient and family have been given the opportunity to ask questions and make suggestions.  [place], [place], [place] 12/15/2016, 2:56 PM

## 2016-12-16 DIAGNOSIS — F3181 Bipolar II disorder: Principal | ICD-10-CM

## 2016-12-16 DIAGNOSIS — F17210 Nicotine dependence, cigarettes, uncomplicated: Secondary | ICD-10-CM

## 2016-12-16 DIAGNOSIS — Z818 Family history of other mental and behavioral disorders: Secondary | ICD-10-CM

## 2016-12-16 MED ORDER — GABAPENTIN 400 MG PO CAPS
400.0000 mg | ORAL_CAPSULE | Freq: Three times a day (TID) | ORAL | Status: DC
  Administered 2016-12-16 – 2016-12-20 (×12): 400 mg via ORAL
  Filled 2016-12-16 (×19): qty 1

## 2016-12-16 MED ORDER — DIVALPROEX SODIUM ER 500 MG PO TB24
1000.0000 mg | ORAL_TABLET | Freq: Every day | ORAL | Status: DC
  Administered 2016-12-16 – 2016-12-20 (×5): 1000 mg via ORAL
  Filled 2016-12-16 (×8): qty 2

## 2016-12-16 NOTE — Progress Notes (Signed)
D: Pt presents with a flat affect and depressed/anxious mood. Pt noted to have slow speech and interaction. Pt woke up this morning asking for anxiety medication. Pt didn't appear to be anxious but Probation officer administered pt her scheduled medications which included an antianxiety med. Pt denies SI/HI. A: Medications reviewed with pt. Medications administered as ordered per MD. Verbal support provided. Pt encouraged to attend groups. Pt encouraged to use coping skills to help cope with anxiety. 15 minute checks performed for safety. R: Pt safety maintained.

## 2016-12-16 NOTE — H&P (Signed)
Psychiatric Admission Assessment Adult  Patient Identification: Jenna Pratt MRN:  [id number] Date of Evaluation:  12/16/2016 Chief Complaint:  Reported to have expressed suicidal thoughts Principal Diagnosis: Bipolar Disorder Diagnosis:   Patient Active Problem List   Diagnosis Date Noted  . Bipolar 2 disorder, major depressive episode ([place]) [F31.81] 12/15/2016  . Cannabis use disorder, moderate, dependence ([place]) [F12.20] 12/15/2016   History of Present Illness:  21 y.o Caucasian female, lives with her girlfriend, employed. 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 Presented to the ER involuntarily. Her mom called the sherriff and requested a wellness check on her. Family is concerned as she had a serious OD last 2023-02-18. Her friend also reported that patient told her she was going to kill herself on that day. Of note, patient was earlier evaluated at the ER. She had passed out in the restroom of a restaurant. Routine labs electrolyte derangement.  UDS was positive for THC.  BAL<10 mg/dl  At interview, patient reports a history of mood disorder since her early teens. Says she has been diagnosed with bipolar disorder and borderline PD. Reports having mood swings. Says she feels anxious most of the time. Reports use of THC to calm her nerves. Says she struggles to sit still, she is not able to focus. Says she has been having frequent anxiety attacks. She passed out twice lately. Says she was having a bad day the day she came in here. She was worried about her repeated syncope and felt there was no point living anymore. Says she told her girlfriend she was going to die but she never had any intention or plan to end her own life. Says she scratched herself earlier at home. Patient odes not usually mutilate self. Says she was just overwhelmed. Says she has a pending assault and battery charge. She is due in court on 12/30/2016. Patient is currently on probation for driving undr  the influence of THC. Says she is not really stressed by anniversary of her brother's death. One passed in 02/17/2005 and the other 06-18-14. Both died accidentally.  Patient reports difficulty getting into sleep. Says she usually requires six pills of Melatonin or Benadryl to get into sleep. Her sleep is broken even with medication. Patient reports normal appetite. No abnormal perception. No persecutory delusion. No other form of delusion. She denies any current suicidal thoughts. No violent thoughts. No homicidal thoughts. She denies use of any synthetic drug. No other substance use. No access to weapons.   Total Time spent with patient: 1 hour  Past Psychiatric History: Bipolar Disorder. She has been tried on multiple agents in the past. Says she did not do well on Lithium. She was recently started on Cariprazine. She is currently on 4.5 mg daily. She is on Gabapentin for anxiety. Reports no benefit. Patient took a fatal OD in 02/18/2016. She was hospitalized at [facility]. Patient denies any previous suicidal behavior. She has been admitted at [facility] in the past.    Is the patient at risk to self? Yes.    Has the patient been a risk to self in the past 6 months? No.  Has the patient been a risk to self within the distant past? Yes.    Is the patient a risk to others? No.  Has the patient been a risk to others in the past 6 months? Yes.    Has the patient been a risk to others within the distant past? Yes.  Prior Inpatient Therapy:   Prior Outpatient Therapy:    Alcohol Screening: 1. How often do you have a drink containing alcohol?: Never 2. How many drinks containing alcohol do you have on a typical day when you are drinking?: 1 or 2 3. How often do you have six or more drinks on one occasion?: Never AUDIT-C Score: 0 4. How often during the last year have you found that you were not able to stop drinking once you had started?: Never 5. How often during the last year have  you failed to do what was normally expected from you becasue of drinking?: Never 6. How often during the last year have you needed a first drink in the morning to get yourself going after a heavy drinking session?: Never 7. How often during the last year have you had a feeling of guilt of remorse after drinking?: Never 8. How often during the last year have you been unable to remember what happened the night before because you had been drinking?: Never 9. Have you or someone else been injured as a result of your drinking?: No 10. Has a relative or friend or a doctor or another health worker been concerned about your drinking or suggested you cut down?: No Alcohol Use Disorder Identification Test Final Score (AUDIT): 0 Intervention/Follow-up: AUDIT Score <7 follow-up not indicated Substance Abuse History in the last 12 months:  Yes.   Consequences of Substance Abuse: As above  Previous Psychotropic Medications: Yes  Psychological Evaluations: Yes  Past Medical History:  Past Medical History:  Diagnosis Date  . Anoxic brain injury ([place])    from previous OD   . Bipolar 1 disorder ([place])   . Depression   . Pituitary tumor   . Seizures ([place])     Past Surgical History:  Procedure Laterality Date  . TONSILLECTOMY     Family History: History reviewed. No pertinent family history. Family Psychiatric  History: Mother suffers from anxiety. She is not sure of any other family history of mental illness. She is not aware of any family history of suicide.  Tobacco Screening: Have you used any form of tobacco in the last 30 days? (Cigarettes, Smokeless Tobacco, Cigars, and/or Pipes): Yes Tobacco use, Select all that apply: 4 or less cigarettes per day Are you interested in Tobacco Cessation Medications?: No, patient refused Counseled patient on smoking cessation including recognizing danger situations, developing coping skills and basic information about quitting provided: Refused/Declined practical  counseling Social History:  Social History   Substance and Sexual Activity  Alcohol Use No  . Frequency: Never     Social History   Substance and Sexual Activity  Drug Use No   Comment: denies     Additional Social History:                           Allergies:   Allergies  Allergen Reactions  . Prozac [Fluoxetine Hcl] Anxiety   Lab Results:  Results for orders placed or performed during the hospital encounter of 12/14/16 (from the past 48 hour(s))  Urinalysis, Routine w reflex microscopic     Status: Abnormal   Collection Time: 12/14/16 10:50 PM  Result Value Ref Range   Color, Urine STRAW (A) YELLOW   APPearance CLEAR CLEAR   Specific Gravity, Urine 1.004 (L) 1.005 - 1.030   pH 6.0 5.0 - 8.0   Glucose, UA NEGATIVE NEGATIVE mg/dL   Hgb urine dipstick NEGATIVE NEGATIVE  Bilirubin Urine NEGATIVE NEGATIVE   Ketones, ur NEGATIVE NEGATIVE mg/dL   Protein, ur NEGATIVE NEGATIVE mg/dL   Nitrite NEGATIVE NEGATIVE   Leukocytes, UA NEGATIVE NEGATIVE  Rapid urine drug screen (hospital performed)     Status: Abnormal   Collection Time: 12/14/16 10:50 PM  Result Value Ref Range   Opiates NONE DETECTED NONE DETECTED   Cocaine NONE DETECTED NONE DETECTED   Benzodiazepines NONE DETECTED NONE DETECTED   Amphetamines NONE DETECTED NONE DETECTED   Tetrahydrocannabinol POSITIVE (A) NONE DETECTED   Barbiturates NONE DETECTED NONE DETECTED    Comment:        DRUG SCREEN FOR MEDICAL PURPOSES ONLY.  IF CONFIRMATION IS NEEDED FOR ANY PURPOSE, NOTIFY LAB WITHIN 5 DAYS.        LOWEST DETECTABLE LIMITS FOR URINE DRUG SCREEN Drug Class       Cutoff (ng/mL) Amphetamine      1000 Barbiturate      200 Benzodiazepine   154 Tricyclics       008 Opiates          300 Cocaine          300 THC              50   CBC with Differential/Platelet     Status: None   Collection Time: 12/14/16 11:01 PM  Result Value Ref Range   WBC 8.0 4.0 - 10.5 K/uL   RBC 4.29 3.87 - 5.11 MIL/uL    Hemoglobin 13.5 12.0 - 15.0 g/dL   HCT 39.0 36.0 - 46.0 %   MCV 90.9 78.0 - 100.0 fL   MCH 31.5 26.0 - 34.0 pg   MCHC 34.6 30.0 - 36.0 g/dL   RDW 12.8 11.5 - 15.5 %   Platelets 223 150 - 400 K/uL   Neutrophils Relative % 47 %   Neutro Abs 3.8 1.7 - 7.7 K/uL   Lymphocytes Relative 41 %   Lymphs Abs 3.3 0.7 - 4.0 K/uL   Monocytes Relative 6 %   Monocytes Absolute 0.5 0.1 - 1.0 K/uL   Eosinophils Relative 6 %   Eosinophils Absolute 0.4 0.0 - 0.7 K/uL   Basophils Relative 0 %   Basophils Absolute 0.0 0.0 - 0.1 K/uL  Comprehensive metabolic panel     Status: Abnormal   Collection Time: 12/14/16 11:01 PM  Result Value Ref Range   Sodium 140 135 - 145 mmol/L   Potassium 3.4 (L) 3.5 - 5.1 mmol/L   Chloride 111 101 - 111 mmol/L   CO2 23 22 - 32 mmol/L   Glucose, Bld 101 (H) 65 - 99 mg/dL   BUN 10 6 - 20 mg/dL   Creatinine, Ser 0.71 0.44 - 1.00 mg/dL   Calcium 8.8 (L) 8.9 - 10.3 mg/dL   Total Protein 6.7 6.5 - 8.1 g/dL   Albumin 4.2 3.5 - 5.0 g/dL   AST 18 15 - 41 U/L   ALT 10 (L) 14 - 54 U/L   Alkaline Phosphatase 52 38 - 126 U/L   Total Bilirubin 0.5 0.3 - 1.2 mg/dL   GFR calc non Af Amer >60 >60 mL/min   GFR calc Af Amer >60 >60 mL/min    Comment: (NOTE) The eGFR has been calculated using the CKD EPI equation. This calculation has not been validated in all clinical situations. eGFR's persistently <60 mL/min signify possible Chronic Kidney Disease.    Anion gap 6 5 - 15  Ethanol     Status: None  Collection Time: 12/14/16 11:01 PM  Result Value Ref Range   Alcohol, Ethyl (B) <10 <10 mg/dL    Comment:        LOWEST DETECTABLE LIMIT FOR SERUM ALCOHOL IS 10 mg/dL FOR MEDICAL PURPOSES ONLY   I-Stat Beta hCG blood, ED ([facility], [facility], [facility] only)     Status: None   Collection Time: 12/14/16 11:12 PM  Result Value Ref Range   I-stat hCG, quantitative <5.0 <5 mIU/mL   Comment 3            Comment:   GEST. AGE      CONC.  (mIU/mL)   <=1 WEEK        5 - 50     2 WEEKS       50 - 500      3 WEEKS       100 - 10,000     4 WEEKS     1,000 - 30,000        FEMALE AND NON-PREGNANT FEMALE:     LESS THAN 5 mIU/mL     Blood Alcohol level:  Lab Results  Component Value Date   ETH <10 16/94/5038    Metabolic Disorder Labs:  No results found for: HGBA1C, MPG No results found for: PROLACTIN No results found for: CHOL, TRIG, HDL, CHOLHDL, VLDL, LDLCALC  Current Medications: Current Facility-Administered Medications  Medication Dose Route Frequency Provider Last Rate Last Dose  . acetaminophen (TYLENOL) tablet 650 mg  650 mg Oral Q6H PRN Ethelene Hal, NP      . alum & mag hydroxide-simeth (MAALOX/MYLANTA) 200-200-20 MG/5ML suspension 30 mL  30 mL Oral Q4H PRN Ethelene Hal, NP      . Cariprazine HCl CAPS 4.5 mg  4.5 mg Oral QHS Ethelene Hal, NP   4.5 mg at 12/15/16 2143  . gabapentin (NEURONTIN) capsule 200 mg  200 mg Oral BID Ethelene Hal, NP   200 mg at 12/16/16 8828  . hydrOXYzine (ATARAX/VISTARIL) tablet 25 mg  25 mg Oral TID Ethelene Hal, NP   25 mg at 12/16/16 1338  . LORazepam (ATIVAN) tablet 1 mg  1 mg Oral Q6H PRN Ethelene Hal, NP   1 mg at 12/15/16 1951  . magnesium hydroxide (MILK OF MAGNESIA) suspension 30 mL  30 mL Oral Daily PRN Ethelene Hal, NP      . traZODone (DESYREL) tablet 50 mg  50 mg Oral QHS PRN Ethelene Hal, NP   50 mg at 12/15/16 2143   PTA Medications: Medications Prior to Admission  Medication Sig Dispense Refill Last Dose  . Cariprazine HCl (VRAYLAR) 4.5 MG CAPS Take 4.5 mg at bedtime by mouth.    12/13/2016 at pm  . dicyclomine (BENTYL) 20 MG tablet Take 1 tablet (20 mg total) by mouth 3 (three) times daily as needed for spasms. (Patient not taking: Reported on 12/14/2016) 30 tablet 0 Not Taking at Unknown time  . ferrous sulfate 325 (65 FE) MG tablet Take 325 mg daily by mouth.   00/34/9179 at am  . FOLIC ACID PO Take 1 tablet daily by mouth.   12/13/2016 at Unknown time  .  gabapentin (NEURONTIN) 100 MG capsule Take 200 mg 3 (three) times daily as needed by mouth (anxiety/panic attacks).   12/14/2016 at noon  . LORazepam (ATIVAN) 1 MG tablet Take 0.5 tablets (0.5 mg total) every 6 (six) hours as needed by mouth for anxiety. 3 tablet 0   .  venlafaxine (EFFEXOR) 25 MG tablet Take 25 mg daily by mouth.   12/14/2016 at am    Musculoskeletal: Strength & Muscle Tone: within normal limits Gait & Station: normal Patient leans: N/A  Psychiatric Specialty Exam: Physical Exam  Constitutional: She appears well-developed and well-nourished.  HENT:  Head: Normocephalic and atraumatic.  Respiratory: Effort normal.  Neurological: She is alert.  Psychiatric:  As above    ROS  Blood pressure 99/70, pulse (!) 115, temperature 98.4 F (36.9 C), temperature source Oral, resp. rate 18, height 5' 2" (1.575 m), weight 46.7 kg (103 lb).Body mass index is 18.84 kg/m.  General Appearance:  Casually dressed, withdrawn, engaged well.   Eye Contact:  Good  Speech:  Soft spoken  Volume:  Decreased  Mood:  Anxious and Depressed  Affect:  Blunted  Thought Process:  Linear  Orientation:  Full (Time, Place, and Person)  Thought Content:  No delusional theme. No preoccupation with violent thoughts. No hallucination in any modality.   Suicidal Thoughts:  Has been having thoughts but denies any intent to act.   Homicidal Thoughts:  No  Memory:  Immediate;   Good Recent;   Fair Remote;   Good  Judgement:  Fair  Insight:  Good  Psychomotor Activity:  Decreased  Concentration:  Concentration: Fair and Attention Span: Fair  Recall:  Good  Fund of Knowledge:  Good  Language:  Good  Akathisia:  Negative  Handed:    AIMS (if indicated):     Assets:  Communication Skills Desire for Improvement Financial Resources/Insurance Housing Intimacy Resilience Transportation Vocational/Educational  ADL's:  Intact  Cognition:  WNL  Sleep:  Number of Hours: 6.75    Treatment Plan  Summary: Patient is currently presenting with a mixed affective state. Recent episode is perpetuated by pending legal issues and repeated syncope attacks. She mutilated self for the first time recently. She had expressed plans to end her life to her partner before being admitted. We discussed adding Depakote to her current regimen.  She consented to treatment after we reviewed the risks and benefits. We also plan to optimize Gabapentin.   Psychiatric: Bipolar Disorder ,,,, mixed state THC use disorder  Medical: Syncope   Psychosocial:  Legal issues  PLAN: 1. Continue Cariprazine 4.5 mg HS 2. Add Depakote ER 1000 mg daily 3. Increase Gabapentin to 400 mg TID 4. Encourage unit groups and activities 5. Monitor mood, behavior and interaction with peers 6. SW would gather collateral from her family 2. Continue IVC     Observation Level/Precautions:  15 minute checks  Laboratory:    Psychotherapy:    Medications:    Consultations:    Discharge Concerns:    Estimated LOS:  Other:     Physician Treatment Plan for Primary Diagnosis: <principal problem not specified> Long Term Goal(s): Improvement in symptoms so as ready for discharge  Short Term Goals: Ability to identify changes in lifestyle to reduce recurrence of condition will improve, Ability to verbalize feelings will improve, Ability to disclose and discuss suicidal ideas, Ability to demonstrate self-control will improve, Ability to identify and develop effective coping behaviors will improve, Ability to maintain clinical measurements within normal limits will improve, Compliance with prescribed medications will improve and Ability to identify triggers associated with substance abuse/mental health issues will improve  Physician Treatment Plan for Secondary Diagnosis: Active Problems:   Bipolar 2 disorder, major depressive episode ([place])  Long Term Goal(s): Improvement in symptoms so as ready for discharge  Short Term Goals:  Ability to identify changes in lifestyle to reduce recurrence of condition will improve, Ability to verbalize feelings will improve, Ability to disclose and discuss suicidal ideas, Ability to demonstrate self-control will improve, Ability to identify and develop effective coping behaviors will improve, Ability to maintain clinical measurements within normal limits will improve, Compliance with prescribed medications will improve and Ability to identify triggers associated with substance abuse/mental health issues will improve  I certify that inpatient services furnished can reasonably be expected to improve the patient's condition.    Artist Beach, MD 11/20/20182:35 PM

## 2016-12-16 NOTE — Progress Notes (Signed)
Recreation Therapy Notes  Animal-Assisted Activity (AAA) Program Checklist/Progress Notes Patient Eligibility Criteria Checklist & Daily Group note for Rec TxIntervention  Date: 11.20.2018 Time: 2:45pm Location: [address]   AAA/T Program Assumption of Risk Form signed by Patient/ or Parent Legal Guardian Yes  Patient is free of allergies or sever asthma Yes  Patient reports no fear of animals Yes  Patient reports no history of cruelty to animals Yes  Patient understands his/her participation is voluntary Yes  Patient washes hands before animal contact Yes  Patient washes hands after animal contact Yes  Behavioral Response: Appropriate   Education:Hand Washing, Appropriate Animal Interaction   Education Outcome: Acknowledges education.   Clinical Observations/Feedback: Patient attended session and interacted appropriately with therapy dog and peers.   Jenna Pratt, LRT/CTRS      Jenna Pratt L 12/16/2016 3:11 PM

## 2016-12-16 NOTE — BHH Group Notes (Signed)
LCSW Group Therapy Note  12/16/2016 1:15pm  Type of Therapy/Topic:  Group Therapy:  Balance in Life  Participation Level:  Active  Description of Group:    This group will address the concept of balance and how it feels and looks when one is unbalanced. Patients will be encouraged to process areas in their lives that are out of balance and identify reasons for remaining unbalanced. Facilitators will guide patients in utilizing problem-solving interventions to address and correct the stressor making their life unbalanced. Understanding and applying boundaries will be explored and addressed for obtaining and maintaining a balanced life. Patients will be encouraged to explore ways to assertively make their unbalanced needs known to significant others in their lives, using other group members and facilitator for support and feedback.  Therapeutic Goals: 1. Patient will identify two or more emotions or situations they have that consume much of in their lives. 2. Patient will identify signs/triggers that life has become out of balance:  3. Patient will identify two ways to set boundaries in order to achieve balance in their lives:  4. Patient will demonstrate ability to communicate their needs through discussion and/or role plays  Summary of Patient Progress:  Participated in group discussion of concept of balance, stressors that contribute to lack of balance and emotional feeling states when out of balance. Patient presented w flat affect, however when connecting w others in the group, she actively contributed and connected w others.  Was especially resonant with peer who described significant work stress.      Therapeutic Modalities:   Cognitive Behavioral Therapy Solution-Focused Therapy Assertiveness Training  Beverely Pace, [place] 12/16/2016 2:46 PM

## 2016-12-16 NOTE — BHH Suicide Risk Assessment (Signed)
[facility] Admission Suicide Risk Assessment   Nursing information obtained from:    Demographic factors:    Current Mental Status:    Loss Factors:    Historical Factors:    Risk Reduction Factors:     Total Time spent with patient: 30 minutes Principal Problem: Bipolar 2 disorder, major depressive episode ([place]) Diagnosis:   Patient Active Problem List   Diagnosis Date Noted  . Bipolar 2 disorder, major depressive episode ([place]) [F31.81] 12/15/2016  . Cannabis use disorder, moderate, dependence ([place]) [F12.20] 12/15/2016   Subjective Data:  21 y.o Caucasian female, lives with her girlfriend, employed. 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 Presented to the ER involuntarily. Her mom called the sherriff and requested a wellness check on her. Family is concerned as she had a serious OD last December. Her friend also reported that patient told her she was going to kill herself on that day. Of note, patient was earlier evaluated at the ER. She had passed out in the restroom of a restaurant. Routine labs electrolyte derangement.  UDS was positive for THC.  BAL<10 mg/dl. Patient is currently in a mixed state. She had previous suicidal attempt, no family history of suicide, no evidence of psychosis. No cognitive impairment. No access to weapons. she is cooperative with care. She has agreed to treatment recommendations. She has agreed to communicate suicidal thoughts of with staff if the thoughts becomes overwhelming.     Continued Clinical Symptoms:  Alcohol Use Disorder Identification Test Final Score (AUDIT): 0 The "Alcohol Use Disorders Identification Test", Guidelines for Use in Primary Care, Second Edition.  World Pharmacologist [facility]). Score between 0-7:  no or low risk or alcohol related problems. Score between 8-15:  moderate risk of alcohol related problems. Score between 16-19:  high risk of alcohol related problems. Score 20 or above:  warrants further diagnostic  evaluation for alcohol dependence and treatment.   CLINICAL FACTORS:   Bipolar Disorder:   Mixed State   Musculoskeletal: Strength & Muscle Tone: within normal limits Gait & Station: normal Patient leans: N/A  Psychiatric Specialty Exam: Physical Exam  ROS  Blood pressure 99/70, pulse (!) 115, temperature 98.4 F (36.9 C), temperature source Oral, resp. rate 18, height 5\' 2"  (1.575 m), weight 46.7 kg (103 lb).Body mass index is 18.84 kg/m.  General Appearance: As in H&P  Eye Contact:    Speech:    Volume:    Mood:    Affect:    Thought Process:    Orientation:    Thought Content:  As in H&P  Suicidal Thoughts:    Homicidal Thoughts:    Memory:    Judgement:    Insight:    Psychomotor Activity:    Concentration:  As in H&P  Recall:    Fund of Knowledge:    Language:    Akathisia:    Handed:    AIMS (if indicated):     Assets:    ADL's:    Cognition:  As in H&P  Sleep:  Number of Hours: 6.75      COGNITIVE FEATURES THAT CONTRIBUTE TO RISK:  None    SUICIDE RISK:   Moderate:  Frequent suicidal ideation with limited intensity, and duration, some specificity in terms of plans, no associated intent, good self-control, limited dysphoria/symptomatology, some risk factors present, and identifiable protective factors, including available and accessible social support.  PLAN OF CARE:  As in H&P'  I certify that inpatient services furnished can reasonably be expected  to improve the patient's condition.   Artist Beach, MD 12/16/2016, 3:40 PM

## 2016-12-17 DIAGNOSIS — G4700 Insomnia, unspecified: Secondary | ICD-10-CM

## 2016-12-17 DIAGNOSIS — F419 Anxiety disorder, unspecified: Secondary | ICD-10-CM

## 2016-12-17 DIAGNOSIS — F1220 Cannabis dependence, uncomplicated: Secondary | ICD-10-CM

## 2016-12-17 DIAGNOSIS — F39 Unspecified mood [affective] disorder: Secondary | ICD-10-CM

## 2016-12-17 NOTE — Social Work (Signed)
CSW called [facility], no current care coordinator assigned.  Per mother, Marya Landry [phone]) has been care coordinator in the past.  Left Vm requesting call back to see if patient can be referred for care coordination.  Edwyna Shell, LCSW Lead Clinical Social Worker Phone:  [phone]

## 2016-12-17 NOTE — Progress Notes (Signed)
[facility] MD Progress Note  12/17/2016 12:27 PM Jenna Pratt  MRN:  [id number]   Subjective:  Patient reports that she feels "zombieish", but no anxiety and her depression is at 2/10. She denies any SI/HI/AVH and contracts for safety. She has a apartment to stay at with her girlfriend, but their roommate was unhappy about the police coming there and has told them they need to move out. She states that this is not immediate and has a couple of months to work things out. She denies any medication side effects.  Objective: Patient is pleasant and cooperative. She appears flat and depressed. She has attended groups and interacted with peers and staff appropriately.   Principal Problem: Bipolar 2 disorder, major depressive episode ([place]) Diagnosis:   Patient Active Problem List   Diagnosis Date Noted  . Bipolar 2 disorder, major depressive episode ([place]) [F31.81] 12/15/2016  . Cannabis use disorder, moderate, dependence ([place]) [F12.20] 12/15/2016   Total Time spent with patient: 15 minutes  Past Psychiatric History: See H&P  Past Medical History:  Past Medical History:  Diagnosis Date  . Anoxic brain injury ([place])    from previous OD   . Bipolar 1 disorder ([place])   . Depression   . Pituitary tumor   . Seizures ([place])     Past Surgical History:  Procedure Laterality Date  . TONSILLECTOMY     Family History: History reviewed. No pertinent family history. Family Psychiatric  History: See H&P Social History:  Social History   Substance and Sexual Activity  Alcohol Use No  . Frequency: Never     Social History   Substance and Sexual Activity  Drug Use No   Comment: denies     Social History   Socioeconomic History  . Marital status: Single    Spouse name: None  . Number of children: None  . Years of education: None  . Highest education level: None  Social Needs  . Financial resource strain: None  . Food insecurity - worry: None  . Food insecurity - inability: None  .  Transportation needs - medical: None  . Transportation needs - non-medical: None  Occupational History  . None  Tobacco Use  . Smoking status: Current Some Day Smoker    Packs/day: 0.25    Types: Cigarettes  . Smokeless tobacco: Never Used  Substance and Sexual Activity  . Alcohol use: No    Frequency: Never  . Drug use: No    Comment: denies   . Sexual activity: Yes    Birth control/protection: None    Comment: pt is not sexually active with men  Other Topics Concern  . None  Social History Narrative  . None   Additional Social History:                         Sleep: Good  Appetite:  Good  Current Medications: Current Facility-Administered Medications  Medication Dose Route Frequency Provider Last Rate Last Dose  . acetaminophen (TYLENOL) tablet 650 mg  650 mg Oral Q6H PRN Ethelene Hal, NP      . alum & mag hydroxide-simeth (MAALOX/MYLANTA) 200-200-20 MG/5ML suspension 30 mL  30 mL Oral Q4H PRN Ethelene Hal, NP      . Cariprazine HCl CAPS 4.5 mg  4.5 mg Oral QHS Ethelene Hal, NP   4.5 mg at 12/15/16 2143  . divalproex (DEPAKOTE ER) 24 hr tablet 1,000 mg  1,000 mg Oral Daily Izediuno,  Laruth Bouchard, MD   1,000 mg at 12/17/16 0830  . gabapentin (NEURONTIN) capsule 400 mg  400 mg Oral TID Artist Beach, MD   400 mg at 12/17/16 0830  . hydrOXYzine (ATARAX/VISTARIL) tablet 25 mg  25 mg Oral TID Ethelene Hal, NP   25 mg at 12/17/16 0830  . LORazepam (ATIVAN) tablet 1 mg  1 mg Oral Q6H PRN Ethelene Hal, NP   1 mg at 12/15/16 1951  . magnesium hydroxide (MILK OF MAGNESIA) suspension 30 mL  30 mL Oral Daily PRN Ethelene Hal, NP      . traZODone (DESYREL) tablet 50 mg  50 mg Oral QHS PRN Ethelene Hal, NP   50 mg at 12/15/16 2143    Lab Results: No results found for this or any previous visit (from the past 48 hour(s)).  Blood Alcohol level:  Lab Results  Component Value Date   ETH <10 57/84/6962     Metabolic Disorder Labs: No results found for: HGBA1C, MPG No results found for: PROLACTIN No results found for: CHOL, TRIG, HDL, CHOLHDL, VLDL, LDLCALC  Physical Findings: AIMS: Facial and Oral Movements Muscles of Facial Expression: None, normal Lips and Perioral Area: None, normal Jaw: None, normal Tongue: None, normal,Extremity Movements Upper (arms, wrists, hands, fingers): None, normal Lower (legs, knees, ankles, toes): None, normal, Trunk Movements Neck, shoulders, hips: None, normal, Overall Severity Severity of abnormal movements (highest score from questions above): None, normal Incapacitation due to abnormal movements: None, normal Patient's awareness of abnormal movements (rate only patient's report): No Awareness, Dental Status Current problems with teeth and/or dentures?: No Does patient usually wear dentures?: No  CIWA:    COWS:     Musculoskeletal: Strength & Muscle Tone: within normal limits Gait & Station: normal Patient leans: N/A  Psychiatric Specialty Exam: Physical Exam  Nursing note and vitals reviewed. Constitutional: She is oriented to person, place, and time. She appears well-developed and well-nourished.  Cardiovascular: Normal rate.  Respiratory: Effort normal.  Musculoskeletal: Normal range of motion.  Neurological: She is alert and oriented to person, place, and time.  Skin: Skin is warm.    Review of Systems  Constitutional: Negative.   HENT: Negative.   Eyes: Negative.   Respiratory: Negative.   Cardiovascular: Negative.   Gastrointestinal: Negative.   Genitourinary: Negative.   Musculoskeletal: Negative.   Skin: Negative.   Neurological: Negative.   Endo/Heme/Allergies: Negative.   Psychiatric/Behavioral: Positive for depression (rates depression at 2/10). Negative for suicidal ideas. The patient is not nervous/anxious.     Blood pressure 115/82, pulse 85, temperature 97.8 F (36.6 C), temperature source Oral, resp. rate 16,  height 5\' 2"  (1.575 m), weight 46.7 kg (103 lb).Body mass index is 18.84 kg/m.  General Appearance: Casual  Eye Contact:  Good  Speech:  Clear and Coherent and Normal Rate  Volume:  Normal  Mood:  Depressed  Affect:  Flat  Thought Process:  Goal Directed and Descriptions of Associations: Intact  Orientation:  Full (Time, Place, and Person)  Thought Content:  WDL  Suicidal Thoughts:  No  Homicidal Thoughts:  No  Memory:  Immediate;   Good Recent;   Good Remote;   Good  Judgement:  Good  Insight:  Good  Psychomotor Activity:  Normal  Concentration:  Concentration: Good and Attention Span: Good  Recall:  Good  Fund of Knowledge:  Good  Language:  Good  Akathisia:  No  Handed:  Right  AIMS (if indicated):  Assets:  Communication Skills Desire for Improvement Financial Resources/Insurance Housing Physical Health Social Support Transportation  ADL's:  Intact  Cognition:  WNL  Sleep:  Number of Hours: 6.75   Problems Addressed: Bipolar II Cannabis abuse  Treatment Plan Summary: Daily contact with patient to assess and evaluate symptoms and progress in treatment, Medication management and Plan is to:  -Continue Cariprazine 4.5 mg PO QHS for mood stability -Continue Depakote 1000 mg PO QHS for mood stability -Continue Gabapentin 400 mg PO TID for mood stability -Continue Ativan 1 mg PO Q6H PRN for anxiety -Continue Vistaril 25 mg PO TID PRN for anxiety -Continue Trazodone 50 mg PO QHS PRN for insomnia -Encourage group therapy participation  Lewis Shock, FNP 12/17/2016, 12:27 PM

## 2016-12-17 NOTE — Progress Notes (Signed)
D: Patient denies SI, HI or AVH this evening.  During initial interaction with patient, she had a female visitor at which time she presented as flat but pleasant.  Patient presented after visitation to the medication window and was noted to be blunted with slow speech, stating that she "felt very tired and wanted to take her bedtime medication".  Pt. Then stated, "if I stay up for group it will look better right and they will let me leave".  Pt. Advised of scheduled medication availability at which time she stated, "I'm just going to go lay down, if I wake up I'll come and get the medication".    A: Patient given emotional support from RN. Patient encouraged to come to staff with concerns and/or questions. Patient's medication routine continued. Patient's orders and plan of care reviewed.   R: Patient remains appropriate and cooperative. Will continue to monitor patient q15 minutes for safety.

## 2016-12-17 NOTE — Social Work (Signed)
Mother concerned that patient is not able to communicate prior adverse reactions to many medications, will fax information on prior adverse reactions.  Edwyna Shell, LCSW Lead Clinical Social Worker Phone:  [phone]

## 2016-12-17 NOTE — [facility] Group Notes (Signed)
LCSW Group Therapy Note  12/17/2016 1:15pm  Type of Therapy and Topic:  Group Therapy:  Feelings around Relapse and Recovery  Participation Level: Active   Description of Group:    Patients in this group will discuss emotions they experience before and after a relapse. They will process how experiencing these feelings, or avoidance of experiencing them, relates to having a relapse. Facilitator will guide patients to explore emotions they have related to recovery. Patients will be encouraged to process which emotions are more powerful. They will be guided to discuss the emotional reaction significant others in their lives may have to their relapse or recovery. Patients will be assisted in exploring ways to respond to the emotions of others without this contributing to a relapse.  Therapeutic Goals: 1. Patient will identify two or more emotions that lead to a relapse for them 2. Patient will identify two emotions that result when they relapse 3. Patient will identify two emotions related to recovery 4. Patient will demonstrate ability to communicate their needs through discussion and/or role plays   Therapeutic Modalities:   Cognitive Behavioral Therapy Solution-Focused Therapy Assertiveness Training Relapse Prevention Therapy   Georga Kaufmann, MSW, [facility] 12/17/2016 3:55 PM

## 2016-12-17 NOTE — Progress Notes (Signed)
Patient ID: Jenna Pratt, female   DOB: July 31, 1995, 21 y.o.   MRN: [id number]  DAR: Pt. Denies SI/HI and A/V Hallucinations. She reports that her sleep last night was good, her appetite is good, her energy level is normal, and her concentration is good. She rates her depression level 0/10, her hopelessness level 0/10, and her anxiety level 4/10. Patient does not report any pain or discomfort at this time. Support and encouragement provided to the patient. Scheduled medications administered to patient per physician's orders. Patient is minimal with this Probation officer but cooperative. Her mother called this afternoon reporting feeling frustrated due to not knowing if her daughter, patient, was being discharged. Patient's mother also stated that patient was beginning to "panic" because she wants to be home. Writer attempted to speak with patient 1:1 about this to provide support however patient was guarded and did not respond to Probation officer. Q15 minute checks are maintained for safety.

## 2016-12-17 NOTE — Progress Notes (Signed)
Recreation Therapy Notes  Date: 12/17/16 Time: 0930 Location: [address] Dayroom  Group Topic: Stress Management  Goal Area(s) Addresses:  Patient will verbalize importance of using healthy stress management.  Patient will identify positive emotions associated with healthy stress management.   Behavioral Response: Engaged  Intervention: Stress Management  Activity :  Body Scan.  LRT introduced the stress management technique of guided body scan.  LRT played a body scan meditation from the Calm app that allowed patients to focus on whatever sensations they may have been feeling and what those sensations felt like to them.   Education:  Stress Management, Discharge Planning.   Education Outcome: Acknowledges edcuation/In group clarification offered/Needs additional education  Clinical Observations/Feedback: Pt attended group.   Victorino Sparrow, LRT/CTRS         Victorino Sparrow A 12/17/2016 12:24 PM

## 2016-12-17 NOTE — [facility] Suicide Risk Assessment (Signed)
[place] INPATIENT:  Family/Significant Other Suicide Prevention Education  Suicide Prevention Education:  Education Completed; Elisse Pennick, mother, [phone],  (name of family member/significant other) has been identified by the patient as the family member/significant other with whom the patient will be residing, and identified as the person(s) who will aid the patient in the event of a mental health crisis (suicidal ideations/suicide attempt).  With written consent from the patient, the family member/significant other has been provided the following suicide prevention education, prior to the and/or following the discharge of the patient.  The suicide prevention education provided includes the following:  Suicide risk factors  Suicide prevention and interventions  National Suicide Hotline telephone number  [facility] assessment telephone number  [facility] Emergency Assistance [place] and/or Residential Mobile Crisis Unit telephone number  Request made of family/significant other to:  Remove weapons (e.g., guns, rifles, knives), all items previously/currently identified as safety concern.    Remove drugs/medications (over-the-counter, prescriptions, illicit drugs), all items previously/currently identified as a safety concern.  The family member/significant other verbalizes understanding of the suicide prevention education information provided.  The family member/significant other agrees to remove the items of safety concern listed above.  Mother has spoken w patient, "she sounds really depressed still, voice sounds lethargic, slurry, sounds depressed to me.  When she's like that, she is not well."  "We expect this every year at this time, she is always extremely depressed from Nov - Feb.  Despite seeing she's is extremely depressed, it would add to her depression to spend Thanksgiving alone at the hospital."  Parents have been through years of struggle w  patient's depression, had prior suicide attempt which was very serious last year.  Seasonal affective disorder and holidays (brother died in car accident 5 years ago at this time, will not talk about her feelings, will not grieve) have been problem since age 85 (same time she was diagnosed w bipolar disorder).  As young child "she slept a lot Nov - Feb,, was put on Prozac for anxiety at age 76, became manic, every antidepressant since then has made her suicidal even when paired w mood stabilizers." Patient had therapist after death of older brother, "she just wont engage, the moment she is held accountable, she will walk out of the session and will not engage."  Mother aware that patient is now "homeless", stays w multiple friends "until she wears out her welcome because she is very aggressive/combative."  Has "charges for assault and battery against her dad and I."  Mother concerned she has returned to substance use, "she really cant be alone but she cant live w other people either."  Mother wants ACT team to help monitor medications, help w appointments.  Has anoxic brain injury from past suicide attempt, "looks like forgetfulness, short term memory loss, long term memory is intact."  "Medications are a mess, she really needs someone to help her w medications but she will not allow it, she cannot remember if she took medications and takes them again, or stops taking them."  Prior to admission, abruptly stopped taking medications.  Mother aware that she cannot force patient to take medications however she decompensates when not taking medications.  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  Has had tardive dyskensia and "severe tics" from some medications, "side effects are worse than what she already feels."  Mother  feels patient would do better in supportive living environment, has  been recommended neuropsychiatrist who did all her testing; however, patient has refused this option.  Mother has also wanted patient on LAI but prolactinoma has created medication dilemma.  "She is noncompliant in addition to have a lot of medication reactions."    Mother has spoken w Marya Landry, care coordinator for [facility], "problem is that she is floating around between [facility], [place] and [place]."  This has caused difficulty coordinating care for patient.    To mother's knowledge, patient does not have access to guns; however "her girlfriend collects [employer] and [place] has access to them." Patient has put all her medications together in one bottle "that's a plan, that's what she did last time."  "Her even having access to her medications is a danger to her, she should not have more than 7 days supply on her."  Mother has considered applying for guardianship given patient's impulsive actions and difficulty w handling herself, but thinks "this would make her hopeless and take away all her hope."    Beverely Pace 12/17/2016, 10:31 AM

## 2016-12-18 NOTE — Progress Notes (Signed)
Patient ID: Jenna Pratt, female   DOB: 1995-08-28, 21 y.o.   MRN: [id number]  D: Patient pleasant on approach tonight. Reports feeling anxious a lot. No active SI at this time. Contracts for safety on the unit. Did receive some ativan prn tonight and got some trazodone later for sleep. Patient wanting discharge and is hopeful about going tomorrow. A: Staff will continue to monitor on q 15 minute checks, follow-treatment plan, and give medications as prescribed.  R: Cooperative on the unit.

## 2016-12-18 NOTE — Progress Notes (Signed)
[facility] MD Progress Note  12/18/2016 9:04 PM Jenna Pratt  MRN:  [id number]   Subjective:  Patient reports that she is ready to go home and that she is fine and that nothing is wrong. She now says that if she is being evicted immediately and her girlfriend needs her help. She denese any SI/HI/AVH and contracts for safety. She continues asking to be discharged and is pressuring to be discharged. She does not want to discuss treatment and only discusses discharge.  Objective: Patient's chart and findings reviewed and discussed with treatment team. Patient appears depressed and has a flat affect. She became tearful and kept stating she was fine when informed she was not being discharged today. Discussed with her about the new medication and getting lab work done in the morning and that did not seem to matter and kept searching for ways to be discharged today.  Principal Problem: Bipolar 2 disorder, major depressive episode ([place]) Diagnosis:   Patient Active Problem List   Diagnosis Date Noted  . Bipolar 2 disorder, major depressive episode ([place]) [F31.81] 12/15/2016  . Cannabis use disorder, moderate, dependence ([place]) [F12.20] 12/15/2016   Total Time spent with patient: 25 minutes  Past Psychiatric History: See H&P  Past Medical History:  Past Medical History:  Diagnosis Date  . Anoxic brain injury ([place])    from previous OD   . Bipolar 1 disorder ([place])   . Depression   . Pituitary tumor   . Seizures ([place])     Past Surgical History:  Procedure Laterality Date  . TONSILLECTOMY     Family History: History reviewed. No pertinent family history. Family Psychiatric  History: See H&P Social History:  Social History   Substance and Sexual Activity  Alcohol Use No  . Frequency: Never     Social History   Substance and Sexual Activity  Drug Use No   Comment: denies     Social History   Socioeconomic History  . Marital status: Single    Spouse name: None  . Number of children:  None  . Years of education: None  . Highest education level: None  Social Needs  . Financial resource strain: None  . Food insecurity - worry: None  . Food insecurity - inability: None  . Transportation needs - medical: None  . Transportation needs - non-medical: None  Occupational History  . None  Tobacco Use  . Smoking status: Current Some Day Smoker    Packs/day: 0.25    Types: Cigarettes  . Smokeless tobacco: Never Used  Substance and Sexual Activity  . Alcohol use: No    Frequency: Never  . Drug use: No    Comment: denies   . Sexual activity: Yes    Birth control/protection: None    Comment: pt is not sexually active with men  Other Topics Concern  . None  Social History Narrative  . None   Additional Social History:                         Sleep: Good  Appetite:  Good  Current Medications: Current Facility-Administered Medications  Medication Dose Route Frequency Provider Last Rate Last Dose  . acetaminophen (TYLENOL) tablet 650 mg  650 mg Oral Q6H PRN Ethelene Hal, NP      . alum & mag hydroxide-simeth (MAALOX/MYLANTA) 200-200-20 MG/5ML suspension 30 mL  30 mL Oral Q4H PRN Ethelene Hal, NP      . Cariprazine HCl  CAPS 4.5 mg  4.5 mg Oral QHS Ethelene Hal, NP   4.5 mg at 12/17/16 2108  . divalproex (DEPAKOTE ER) 24 hr tablet 1,000 mg  1,000 mg Oral Daily Izediuno, Laruth Bouchard, MD   1,000 mg at 12/18/16 0809  . gabapentin (NEURONTIN) capsule 400 mg  400 mg Oral TID Artist Beach, MD   400 mg at 12/18/16 1718  . hydrOXYzine (ATARAX/VISTARIL) tablet 25 mg  25 mg Oral TID Ethelene Hal, NP   25 mg at 12/18/16 1718  . LORazepam (ATIVAN) tablet 1 mg  1 mg Oral Q6H PRN Ethelene Hal, NP   1 mg at 12/17/16 2110  . magnesium hydroxide (MILK OF MAGNESIA) suspension 30 mL  30 mL Oral Daily PRN Ethelene Hal, NP      . traZODone (DESYREL) tablet 50 mg  50 mg Oral QHS PRN Ethelene Hal, NP   50 mg at  12/17/16 2307    Lab Results: No results found for this or any previous visit (from the past 48 hour(s)).  Blood Alcohol level:  Lab Results  Component Value Date   ETH <10 73/71/0626    Metabolic Disorder Labs: No results found for: HGBA1C, MPG No results found for: PROLACTIN No results found for: CHOL, TRIG, HDL, CHOLHDL, VLDL, LDLCALC  Physical Findings: AIMS: Facial and Oral Movements Muscles of Facial Expression: None, normal Lips and Perioral Area: None, normal Jaw: None, normal Tongue: None, normal,Extremity Movements Upper (arms, wrists, hands, fingers): None, normal Lower (legs, knees, ankles, toes): None, normal, Trunk Movements Neck, shoulders, hips: None, normal, Overall Severity Severity of abnormal movements (highest score from questions above): None, normal Incapacitation due to abnormal movements: None, normal Patient's awareness of abnormal movements (rate only patient's report): No Awareness, Dental Status Current problems with teeth and/or dentures?: No Does patient usually wear dentures?: No  CIWA:    COWS:     Musculoskeletal: Strength & Muscle Tone: within normal limits Gait & Station: normal Patient leans: N/A  Psychiatric Specialty Exam: Physical Exam  Nursing note and vitals reviewed. Constitutional: She is oriented to person, place, and time. She appears well-developed and well-nourished.  Cardiovascular: Normal rate.  Respiratory: Effort normal.  Musculoskeletal: Normal range of motion.  Neurological: She is alert and oriented to person, place, and time.  Skin: Skin is warm.    Review of Systems  Constitutional: Negative.   HENT: Negative.   Eyes: Negative.   Respiratory: Negative.   Cardiovascular: Negative.   Gastrointestinal: Negative.   Genitourinary: Negative.   Musculoskeletal: Negative.   Skin: Negative.   Neurological: Negative.   Endo/Heme/Allergies: Negative.     Blood pressure 120/86, pulse 96, temperature 99.1 F  (37.3 C), temperature source Oral, resp. rate 16, height 5\' 2"  (1.575 m), weight 46.7 kg (103 lb).Body mass index is 18.84 kg/m.  General Appearance: Casual  Eye Contact:  Good  Speech:  Clear and Coherent and Normal Rate  Volume:  Normal  Mood:  Depressed  Affect:  Flat and Tearful  Thought Process:  Goal Directed and Descriptions of Associations: Intact  Orientation:  Full (Time, Place, and Person)  Thought Content:  WDL  Suicidal Thoughts:  No  Homicidal Thoughts:  No  Memory:  Immediate;   Good Recent;   Good Remote;   Good  Judgement:  Fair  Insight:  Fair  Psychomotor Activity:  Normal  Concentration:  Concentration: Good and Attention Span: Good  Recall:  Good  Fund of Knowledge:  Good  Language:  Good  Akathisia:  No  Handed:  Right  AIMS (if indicated):     Assets:  Communication Skills Desire for Improvement Financial Resources/Insurance Housing Physical Health Social Support Transportation  ADL's:  Intact  Cognition:  WNL  Sleep:  Number of Hours: 6.75   Problems Addressed: Bipolar II disorder  Treatment Plan Summary: Daily contact with patient to assess and evaluate symptoms and progress in treatment, Medication management and Plan is to:  -Continue Cariprazine 4.5 mg PO Daily for mood stability -Continue Depakote 1000 mg PO QHS for mood stability -Ordered Valproic Acid level for an AM draw -Continue Gabapentin 400 mg PO TID  -Continue Ativan 1 mg PO Q6H PRN for anxiety -Continue Vistaril 25 mg PO TID PRN for anxiety -Continue Trazodone 50 mg PO QHS PRN for insomnia -Encourage group therapy participation  Lewis Shock, FNP 12/18/2016, 9:04 PM

## 2016-12-18 NOTE — Plan of Care (Signed)
Pt safe on the unit, pt only focus is on D/c , pt has no insight into her Tx

## 2016-12-18 NOTE — Progress Notes (Signed)
Report received. Patient observed in bed asleep. Respirations even and non labored. No distress noted. Monitoring of patient continues. Patient remains safe on unit.

## 2016-12-18 NOTE — Progress Notes (Signed)
D: Pt denies SI/HI/AVH. Pt is pleasant and cooperative. Pt had minimal interaction on the unit. Pt appeared a little lethargic this evening. Pt stated " Just knock me out"   A: Pt was offered support and encouragement. Pt was given scheduled medications. Pt was encourage to attend groups. Q 15 minute checks were done for safety.   R: Pt is taking medication.  safety maintained on unit.

## 2016-12-18 NOTE — Progress Notes (Signed)
Pt attended evening wrap up group stated that today was a 10 for her she was able to see her girlfriend and her parents for the holiday

## 2016-12-18 NOTE — Progress Notes (Signed)
Patient ID: Jenna Pratt, female   DOB: Feb 27, 1995, 21 y.o.   MRN: [id number]  DAR: Pt. Denies SI/HI and A/V Hallucinations. She reports that her sleep last night was god, her appetite is good, her energy level is normal, and concentration is good. She rates her depression, hopelessness, and anxiety level 0/10. Patient does not report any pain or discomfort at this time. Support and encouragement provided to the patient. Scheduled medications administered to patient per physician's orders. Patient is minimal with Probation officer but cooperative. She is seen this morning with tears in her eyes. Patient was upset due to not discharging today. Patient appears depressed in mood this morning. This afternoon patient's partner visited her. Q15 minute checks are maintained for safety.

## 2016-12-19 LAB — VALPROIC ACID LEVEL: VALPROIC ACID LVL: 85 ug/mL (ref 50.0–100.0)
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
## 2016-12-19 NOTE — Progress Notes (Signed)
[facility] MD Progress Note  12/19/2016 9:58 AM Jenna Pratt  MRN:  [id number] Subjective:   21 y.o Caucasian female, lives with her girlfriend, employed. 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 Presented to the ER involuntarily. Her mom called the sherriff and requested a wellness check on her. Family is concerned as she had a serious OD last December. Her girlfriend also reported that patient told her she was going to kill herself on that day. Of note, patient was earlier evaluated at the ER. She had passed out in the restroom of a restaurant. Routine labs electrolyte derangement.  UDS was positive for THC.  BAL<10 mg/dl  Chart reviewed today. Patient discussed at team today.  Staff reports that she has been focused on being discharged. She has been dismissive of being suicidal. Gets emotional when discharge is not offered. Warms up with time. She has been socializing with peers. She has been taking her medications as prescribed.  No PRN medication lately.  No behavioral issues. She has not been observed to be internally stimulated.   Seen today. Patient says she feels good on her medication. No side effects from Depakote. She is therapeutic. Patient shared that she had not been adherent with her medications at home. Says she was feeling down and overwhelmed. Says now she is back on her medications, she is feeling much better. Says she is now sleeping well. She is no longer feeling irritable. Says suicidal thoughts has resolved. Her family came to visit her yesterday. Says she enjoyed being with them. Patient says she and her girlfriend have to move to another apartment. This was anticipated and they have money saved for it. Patient is not overwhelmed by it. No new stressors. No violent thoughts. No abnormal perception.   I spoke to her mom Jenna Pratt on [phone]. She reports that patient looks much better now she is back on her medication. Says patient usually is not adherent. She  tends to forget taking her medications. Substance use without the medications triggers aggressive outburst. Mom is considering guardianship so patient can be on LAI. Mom notes that she is back to baseline and she is pleased with discharge plans for tomorrow.   Principal Problem: Bipolar 2 disorder, major depressive episode ([place]) Diagnosis:   Patient Active Problem List   Diagnosis Date Noted  . Bipolar 2 disorder, major depressive episode ([place]) [F31.81] 12/15/2016  . Cannabis use disorder, moderate, dependence ([place]) [F12.20] 12/15/2016   Total Time spent with patient: 30 minutes  Past Psychiatric History: As in H&P  Past Medical History:  Past Medical History:  Diagnosis Date  . Anoxic brain injury ([place])    from previous OD   . Bipolar 1 disorder ([place])   . Depression   . Pituitary tumor   . Seizures ([place])     Past Surgical History:  Procedure Laterality Date  . TONSILLECTOMY     Family History: History reviewed. No pertinent family history. Family Psychiatric  History: As in H&P Social History:  Social History   Substance and Sexual Activity  Alcohol Use No  . Frequency: Never     Social History   Substance and Sexual Activity  Drug Use No   Comment: denies     Social History   Socioeconomic History  . Marital status: Single    Spouse name: None  . Number of children: None  . Years of education: None  . Highest education level: None  Social Needs  . Financial resource strain: None  .  Food insecurity - worry: None  . Food insecurity - inability: None  . Transportation needs - medical: None  . Transportation needs - non-medical: None  Occupational History  . None  Tobacco Use  . Smoking status: Current Some Day Smoker    Packs/day: 0.25    Types: Cigarettes  . Smokeless tobacco: Never Used  Substance and Sexual Activity  . Alcohol use: No    Frequency: Never  . Drug use: No    Comment: denies   . Sexual activity: Yes    Birth control/protection: None     Comment: pt is not sexually active with men  Other Topics Concern  . None  Social History Narrative  . None   Additional Social History:      Sleep: Good  Appetite:  Good  Current Medications: Current Facility-Administered Medications  Medication Dose Route Frequency Provider Last Rate Last Dose  . acetaminophen (TYLENOL) tablet 650 mg  650 mg Oral Q6H PRN Ethelene Hal, NP      . alum & mag hydroxide-simeth (MAALOX/MYLANTA) 200-200-20 MG/5ML suspension 30 mL  30 mL Oral Q4H PRN Ethelene Hal, NP      . Cariprazine HCl CAPS 4.5 mg  4.5 mg Oral QHS Ethelene Hal, NP   4.5 mg at 12/18/16 2106  . divalproex (DEPAKOTE ER) 24 hr tablet 1,000 mg  1,000 mg Oral Daily Izediuno, Laruth Bouchard, MD   1,000 mg at 12/19/16 0825  . gabapentin (NEURONTIN) capsule 400 mg  400 mg Oral TID Artist Beach, MD   400 mg at 12/19/16 0825  . hydrOXYzine (ATARAX/VISTARIL) tablet 25 mg  25 mg Oral TID Ethelene Hal, NP   25 mg at 12/19/16 0825  . LORazepam (ATIVAN) tablet 1 mg  1 mg Oral Q6H PRN Ethelene Hal, NP   1 mg at 12/17/16 2110  . magnesium hydroxide (MILK OF MAGNESIA) suspension 30 mL  30 mL Oral Daily PRN Ethelene Hal, NP      . traZODone (DESYREL) tablet 50 mg  50 mg Oral QHS PRN Ethelene Hal, NP   50 mg at 12/18/16 2106    Lab Results:  Results for orders placed or performed during the hospital encounter of 12/15/16 (from the past 48 hour(s))  Valproic acid level     Status: None   Collection Time: 12/19/16  6:08 AM  Result Value Ref Range   Valproic Acid Lvl 85 50.0 - 100.0 ug/mL    Comment: Performed at [facility], [place] [address], [place], [place] [address]    Blood Alcohol level:  Lab Results  Component Value Date   ETH <10 23/55/7322    Metabolic Disorder Labs: No results found for: HGBA1C, MPG No results found for: PROLACTIN No results found for: CHOL, TRIG, HDL, CHOLHDL, VLDL,  LDLCALC  Physical Findings: AIMS: Facial and Oral Movements Muscles of Facial Expression: None, normal Lips and Perioral Area: None, normal Jaw: None, normal Tongue: None, normal,Extremity Movements Upper (arms, wrists, hands, fingers): None, normal Lower (legs, knees, ankles, toes): None, normal, Trunk Movements Neck, shoulders, hips: None, normal, Overall Severity Severity of abnormal movements (highest score from questions above): None, normal Incapacitation due to abnormal movements: None, normal Patient's awareness of abnormal movements (rate only patient's report): No Awareness, Dental Status Current problems with teeth and/or dentures?: No Does patient usually wear dentures?: No  CIWA:    COWS:     Musculoskeletal: Strength & Muscle Tone: within normal limits Gait &  Station: normal Patient leans: N/A  Psychiatric Specialty Exam: Physical Exam  Constitutional: She is oriented to person, place, and time. She appears well-developed and well-nourished.  HENT:  Head: Normocephalic and atraumatic.  Respiratory: Effort normal.  Neurological: She is alert and oriented to person, place, and time.  Psychiatric:  As above    ROS  Blood pressure 109/63, pulse 99, temperature 98.5 F (36.9 C), temperature source Oral, resp. rate 16, height 5\' 2"  (1.575 m), weight 46.7 kg (103 lb).Body mass index is 18.84 kg/m.  General Appearance: Neatly dressed, calm and cooperative. Mobilizing affect appropriately   Eye Contact:  Good  Speech:  Clear and Coherent and Normal Rate  Volume:  Normal  Mood:  Euthymic  Affect:  Appropriate and Full Range  Thought Process:  Linear  Orientation:  Full (Time, Place, and Person)  Thought Content:  No delusional theme. No preoccupation with violent thoughts. No negative ruminations. No obsession.  No hallucination in any modality.   Suicidal Thoughts:  No  Homicidal Thoughts:  No  Memory:  Immediate;   Good Recent;   Good Remote;   Good   Judgement:  Good  Insight:  Good  Psychomotor Activity:  Normal  Concentration:  Concentration: Good and Attention Span: Good  Recall:  Good  Fund of Knowledge:  Good  Language:  Good  Akathisia:  Negative  Handed:    AIMS (if indicated):     Assets:  Financial Resources/Insurance Housing Intimacy Resilience Vocational/Educational  ADL's:  Intact  Cognition:  WNL  Sleep:  Number of Hours: 6.75     Treatment Plan Summary: Patient has stabilized back on medications. She is no longer suicidal. She is not manic or depressed. She is not psychotic. She is not expressing any violent or homicidal thoughts. We plan to evaluate her overnight. Hopeful discharge tomorrow if she remains stable.   Psychiatric: Bipolar Disorder ,,,, mixed state THC use disorder  Medical: Syncope  anoxic brain injury  Psychosocial:  Legal issues  PLAN: 1. Continue current regimen 2. Continue to monitor mood, behavior and interaction with peers   Artist Beach, MD 12/19/2016, 9:58 AM

## 2016-12-19 NOTE — Progress Notes (Signed)
D: Pt continues to be very flat and depressed on the unit today. Pt also continues to be very isolative.  Pt stayed in the bed most of the time, pt stated that she feel sleepy most of the time and think that medications are causing that. Pt reported this morning that she was ready for discharge. Pt reported that her depression was a 0, his hopelessness was a 0, and that his anxiety was a 0. Had good night sleep good appetite, normal energy, and good concentration. Pt reported being negative SI/HI, no AH/VH noted. A: 15 min checks continued for patient safety. R: Pts safety maintained.

## 2016-12-19 NOTE — Progress Notes (Signed)
Pt attended evening wrap up group and stated that today was a 10 she was able to see her girlfriend and is anticipating discharge tomorrow. If she could travel anywhere in the world she said she would go to [place] to see the culture.

## 2016-12-19 NOTE — [facility] Group Notes (Signed)
Type of Therapy and Topic:  Group Therapy: Relapse Prevention and Recovery   Participation Level:  Limited   Description of Group:    In this group patients will be encouraged to explore what they see as obstacles to their own wellness and recovery. They will be guided to discuss their thoughts, feelings, and behaviors related to these obstacles. The group will process together ways to cope with barriers, with attention given to specific choices patients can make. Each patient will be challenged to identify changes they are motivated to make in order to overcome their obstacles. This group will be process-oriented, with patients participating in exploration of their own experiences as well as giving and receiving support and challenge from other group members.   Therapeutic Goals: 1. Patient will identify personal and current obstacles as they relate to admission. 2. Patient will identify barriers that currently interfere with their wellness or overcoming obstacles.  3. Patient will identify feelings, thought process and behaviors related to these barriers. 4. Patient will identify two changes they are willing to make to overcome these obstacles:      Summary of Patient Progress Stayed the entire time. Jenna Pratt did not speak much throughout the group but listened to others speak. She say that changing her setting in areas of people, places and things will be helpful to her in focusing on her recovery after she leaves the hospital. Mood good, grounded in reality.    Therapeutic Modalities:   Cognitive Behavioral Therapy Solution Focused Therapy Motivational Interviewing Relapse Prevention Therapy    Darin Engels MSW, [facility] 12/19/2016 3:43 PM

## 2016-12-19 NOTE — Tx Team (Signed)
Interdisciplinary Treatment and Diagnostic Plan Update  12/19/2016 Time of Session: 9:00 AM Jenna Pratt MRN: [id number]  Principal Diagnosis: Bipolar 2 disorder, major depressive episode ([place])  Secondary Diagnoses: Principal Problem:   Bipolar 2 disorder, major depressive episode ([place])   Current Medications:  Current Facility-Administered Medications  Medication Dose Route Frequency Provider Last Rate Last Dose  . acetaminophen (TYLENOL) tablet 650 mg  650 mg Oral Q6H PRN Ethelene Hal, NP      . alum & mag hydroxide-simeth (MAALOX/MYLANTA) 200-200-20 MG/5ML suspension 30 mL  30 mL Oral Q4H PRN Ethelene Hal, NP      . Cariprazine HCl CAPS 4.5 mg  4.5 mg Oral QHS Ethelene Hal, NP   4.5 mg at 12/18/16 2106  . divalproex (DEPAKOTE ER) 24 hr tablet 1,000 mg  1,000 mg Oral Daily Izediuno, Laruth Bouchard, MD   1,000 mg at 12/19/16 0825  . gabapentin (NEURONTIN) capsule 400 mg  400 mg Oral TID Artist Beach, MD   400 mg at 12/19/16 1212  . hydrOXYzine (ATARAX/VISTARIL) tablet 25 mg  25 mg Oral TID Ethelene Hal, NP   25 mg at 12/19/16 1211  . magnesium hydroxide (MILK OF MAGNESIA) suspension 30 mL  30 mL Oral Daily PRN Ethelene Hal, NP      . traZODone (DESYREL) tablet 50 mg  50 mg Oral QHS PRN Ethelene Hal, NP   50 mg at 12/18/16 2106   PTA Medications: Medications Prior to Admission  Medication Sig Dispense Refill Last Dose  . Cariprazine HCl (VRAYLAR) 4.5 MG CAPS Take 4.5 mg at bedtime by mouth.    12/13/2016 at pm  . dicyclomine (BENTYL) 20 MG tablet Take 1 tablet (20 mg total) by mouth 3 (three) times daily as needed for spasms. (Patient not taking: Reported on 12/14/2016) 30 tablet 0 Not Taking at Unknown time  . ferrous sulfate 325 (65 FE) MG tablet Take 325 mg daily by mouth.   07/37/1062 at am  . FOLIC ACID PO Take 1 tablet daily by mouth.   12/13/2016 at Unknown time  . gabapentin (NEURONTIN) 100 MG capsule Take 200 mg 3  (three) times daily as needed by mouth (anxiety/panic attacks).   12/14/2016 at noon  . LORazepam (ATIVAN) 1 MG tablet Take 0.5 tablets (0.5 mg total) every 6 (six) hours as needed by mouth for anxiety. 3 tablet 0   . venlafaxine (EFFEXOR) 25 MG tablet Take 25 mg daily by mouth.   12/14/2016 at am    Patient Stressors: Financial difficulties Marital or family conflict  Patient Strengths: Ability for insight Average or above average intelligence Capable of independent living [employer] of knowledge Motivation for treatment/growth  Treatment Modalities: Medication Management, Group therapy, Case management,  1 to 1 session with clinician, Psychoeducation, Recreational therapy.   Physician Treatment Plan for Primary Diagnosis: Bipolar 2 disorder, major depressive episode ([place]) Long Term Goal(s): Improvement in symptoms so as ready for discharge Improvement in symptoms so as ready for discharge   Short Term Goals: Ability to identify changes in lifestyle to reduce recurrence of condition will improve Ability to verbalize feelings will improve Ability to disclose and discuss suicidal ideas Ability to demonstrate self-control will improve Ability to identify and develop effective coping behaviors will improve Ability to maintain clinical measurements within normal limits will improve Compliance with prescribed medications will improve Ability to identify triggers associated with substance abuse/mental health issues will improve Ability to identify changes in lifestyle to reduce  recurrence of condition will improve Ability to verbalize feelings will improve Ability to disclose and discuss suicidal ideas Ability to demonstrate self-control will improve Ability to identify and develop effective coping behaviors will improve Ability to maintain clinical measurements within normal limits will improve Compliance with prescribed medications will improve Ability to identify triggers  associated with substance abuse/mental health issues will improve  Medication Management: Evaluate patient's response, side effects, and tolerance of medication regimen.  Therapeutic Interventions: 1 to 1 sessions, Unit Group sessions and Medication administration.  Evaluation of Outcomes: Progressing  Physician Treatment Plan for Secondary Diagnosis: Principal Problem:   Bipolar 2 disorder, major depressive episode ([place])  Long Term Goal(s): Improvement in symptoms so as ready for discharge Improvement in symptoms so as ready for discharge   Short Term Goals: Ability to identify changes in lifestyle to reduce recurrence of condition will improve Ability to verbalize feelings will improve Ability to disclose and discuss suicidal ideas Ability to demonstrate self-control will improve Ability to identify and develop effective coping behaviors will improve Ability to maintain clinical measurements within normal limits will improve Compliance with prescribed medications will improve Ability to identify triggers associated with substance abuse/mental health issues will improve Ability to identify changes in lifestyle to reduce recurrence of condition will improve Ability to verbalize feelings will improve Ability to disclose and discuss suicidal ideas Ability to demonstrate self-control will improve Ability to identify and develop effective coping behaviors will improve Ability to maintain clinical measurements within normal limits will improve Compliance with prescribed medications will improve Ability to identify triggers associated with substance abuse/mental health issues will improve     Medication Management: Evaluate patient's response, side effects, and tolerance of medication regimen.  Therapeutic Interventions: 1 to 1 sessions, Unit Group sessions and Medication administration.  Evaluation of Outcomes: Progressing   RN Treatment Plan for Primary Diagnosis: Bipolar 2  disorder, major depressive episode ([place]) Long Term Goal(s): Knowledge of disease and therapeutic regimen to maintain health will improve  Short Term Goals: Ability to demonstrate self-control, Ability to participate in decision making will improve, Ability to verbalize feelings will improve and Ability to disclose and discuss suicidal ideas  Medication Management: RN will administer medications as ordered by provider, will assess and evaluate patient's response and provide education to patient for prescribed medication. RN will report any adverse and/or side effects to prescribing provider.  Therapeutic Interventions: 1 on 1 counseling sessions, Psychoeducation, Medication administration, Evaluate responses to treatment, Monitor vital signs and CBGs as ordered, Perform/monitor CIWA, COWS, AIMS and Fall Risk screenings as ordered, Perform wound care treatments as ordered.  Evaluation of Outcomes: Progressing   LCSW Treatment Plan for Primary Diagnosis: Bipolar 2 disorder, major depressive episode ([place]) Long Term Goal(s): Safe transition to appropriate next level of care at discharge, Engage patient in therapeutic group addressing interpersonal concerns.  Short Term Goals: Engage patient in aftercare planning with referrals and resources, Increase emotional regulation, Facilitate acceptance of mental health diagnosis and concerns, Identify triggers associated with mental health/substance abuse issues and Increase skills for wellness and recovery  Therapeutic Interventions: Assess for all discharge needs, 1 to 1 time with Social worker, Explore available resources and support systems, Assess for adequacy in community support network, Educate family and significant other(s) on suicide prevention, Complete Psychosocial Assessment, Interpersonal group therapy.  Evaluation of Outcomes: Progressing   Progress in Treatment: Attending groups: Yes. Participating in groups: Yes. Taking medication as  prescribed: Yes. Toleration medication: Yes. Family/Significant other contact made: Yes, individual(s) contacted:  mother Patient understands diagnosis: Yes. limited understanding and acceptance Discussing patient identified problems/goals with staff: Yes. Medical problems stabilized or resolved: Yes. Denies suicidal/homicidal ideation: Yes. admitted w suicidal ideation and has serious attempt in past, minimizes  Issues/concerns per patient self-inventory: No. Other: na  New problem(s) identified: Yes, Describe:  patient's next available apppointment w outpatient provider is far out from discharge, referred to walk in hours at provider; has been referred for care coordination, however unstable living arrangements have presented care coordinator from engaging in past  New Short Term/Long Term Goal(s): "I want to get out of here, I need to get back to work"; limited understanding of need for treatment  Discharge Plan or Barriers: return to community, follow up outpatient, patient will need to identify housing as she states she will be homeless, mother has offered her a place to stay but also expects patient to comply w treatment  Reason for Continuation of Hospitalization: Depression Medication stabilization Suicidal ideation  Estimated Length of Stay: 5 - 7 days, 12/21/16  Attendees: Patient: 12/19/2016 4:24 PM  Physician:  Marchelle Folks MD 12/19/2016 4:24 PM  Nursing:  Cyndia Bent RN 12/19/2016 4:24 PM  RN Care Manager: 12/19/2016 4:24 PM  Social Worker: Eusebio Me Isaiah Blakes LCSWA 12/19/2016 4:24 PM  Recreational Therapist:  12/19/2016 4:24 PM  Other:  12/19/2016 4:24 PM  Other:  12/19/2016 4:24 PM  Other: 12/19/2016 4:24 PM    Scribe for Treatment Team: Beverely Pace, LCSW 12/19/2016 4:24 PM

## 2016-12-20 MED ORDER — HYDROXYZINE HCL 25 MG PO TABS: 25.0000 mg | ORAL_TABLET | Freq: Three times a day (TID) | ORAL | 0 refills | Status: DC

## 2016-12-20 MED ORDER — DIVALPROEX SODIUM ER 500 MG PO TB24: 1000.0000 mg | ORAL_TABLET | Freq: Every day | ORAL | 0 refills | Status: DC

## 2016-12-20 MED ORDER — TRAZODONE HCL 50 MG PO TABS: 50.0000 mg | ORAL_TABLET | Freq: Every evening | ORAL | 0 refills | Status: DC | PRN

## 2016-12-20 MED ORDER — CARIPRAZINE HCL 1.5 MG PO CAPS: 4.5000 mg | ORAL_CAPSULE | Freq: Every day | ORAL | 0 refills | Status: DC

## 2016-12-20 MED ORDER — GABAPENTIN 400 MG PO CAPS: 400.0000 mg | ORAL_CAPSULE | Freq: Three times a day (TID) | ORAL | 0 refills | Status: DC

## 2016-12-20 NOTE — Progress Notes (Signed)
Patient discharged to lobby. Patient was stable and appreciative at that time. All papers and prescriptions were given and valuables returned. Verbal understanding expressed. Denies SI/HI and A/VH. Patient given opportunity to express concerns and ask questions.  

## 2016-12-20 NOTE — BHH Suicide Risk Assessment (Signed)
[facility] Discharge Suicide Risk Assessment   Principal Problem: Bipolar 2 disorder, major depressive episode [facility]) Discharge Diagnoses:  Patient Active Problem List   Diagnosis Date Noted  . Bipolar 2 disorder, major depressive episode ([place]) [F31.81] 12/15/2016  . Cannabis use disorder, moderate, dependence ([place]) [F12.20] 12/15/2016    Total Time spent with patient: 30 minutes  Musculoskeletal: Strength & Muscle Tone: within normal limits Gait & Station: normal Patient leans: N/A  Psychiatric Specialty Exam: Review of Systems  Constitutional: Negative.   HENT: Negative.   Eyes: Negative.   Respiratory: Negative.   Cardiovascular: Negative.   Gastrointestinal: Negative.   Genitourinary: Negative.   Musculoskeletal: Negative.   Skin: Negative.   Neurological: Negative.   Psychiatric/Behavioral: Negative for depression, hallucinations, memory loss and suicidal ideas. The patient is not nervous/anxious and does not have insomnia.     Blood pressure 122/74, pulse (!) 110, temperature 98.3 F (36.8 C), temperature source Oral, resp. rate 14, height 5\' 2"  (1.575 m), weight 46.7 kg (103 lb).Body mass index is 18.84 kg/m.  General Appearance: Neatly dressed, pleasant, engaging well and cooperative. Appropriate behavior. Not in any distress. Good relatedness. Not internally stimulated.  Eye Contact::  Good  Speech:  Spontaneous, normal prosody. Normal tone and rate.   Volume:  Normal  Mood:  Euthymic  Affect:  Appropriate and Full Range  Thought Process:  Goal Directed  Orientation:  Full (Time, Place, and Person)  Thought Content:  Future oriented. No delusional theme. No preoccupation with violent thoughts. No negative ruminations. No obsession.  No hallucination in any modality.   Suicidal Thoughts:  No  Homicidal Thoughts:  No  Memory:  Immediate;   Good Recent;   Good Remote;   Good  Judgement:  Good  Insight:  Good  Psychomotor Activity:  Normal  Concentration:  Good   Recall:  Good  Fund of Knowledge:Good  Language: Good  Akathisia:  Negative  Handed:    AIMS (if indicated):     Assets:  Communication Skills Housing Intimacy Leisure Time Physical Health Resilience Transportation Vocational/Educational  Sleep:  Number of Hours: 6.75  Cognition: WNL  ADL's:  Intact   Clinical  Assessment::   21y.o Caucasian female, lives with her girlfriend, employed. 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 Presented to the ERinvoluntarily. Her mom called the sherriff and requested a wellness check on her. Family is concerned as she had a serious OD last December. Her girlfriend also reported that patient told her she was going to kill herself on that day. Of note, patient was earlier evaluated at the ER. She had passed out in the restroom of a restaurant. Routine labselectrolyte derangement.UDS was positive for THC.BAL10mg /dl.  Seen today. Doing well. Says she is is excited to be back with er girlfriend and her family. No longer feeling depressed. Reports normal energy and interest. Has been maintaining normal biological functions. She is able to think clearly. She is able to focus on task. Her thoughts are not crowded or racing. No evidence of mania. No hallucination in any modality. She is not making any delusional statement. No passivity of will/thought. She is fully in touch with reality. No thoughts of suicide. No thoughts of homicide. No violent thoughts. No overwhelming anxiety. No access to weapons, no craving for substances.  Nursing staff reports that patient has been appropriate on the unit. Patient has been interacting well with peers. No behavioral issues. Patient has not voiced any suicidal thoughts. Patient has not been observed to be  internally stimulated. Patient has been adherent with treatment recommendations. Patient has been tolerating their medication well.   Patient was discussed at team. Team members feels that  patient is back to her baseline level of function. Team agrees with plan to discharge patient today.     Demographic Factors:  Caucasian  Loss Factors: Legal issues  Historical Factors: Prior suicide attempts and Impulsivity  Risk Reduction Factors:   Sense of responsibility to family, Employed, Living with another person, especially a relative, Positive social support, Positive therapeutic relationship and Positive coping skills or problem solving skills  Continued Clinical Symptoms:  As above   Cognitive Features That Contribute To Risk:  Past history of anoxic brain injury.     Suicide Risk:  Minimal: No identifiable suicidal ideation.  Patient is not having any thoughts of suicide at this time. Modifiable risk factors targeted during this admission includes depression and substance use. Demographical and historical risk factors cannot be modified. Patient is now engaging well. Patient is reliable and is future oriented. We have buffered patient's support structures. At this point, patient is at low risk of suicide. Patient is aware of the effects of psychoactive substances on decision making process. Patient has been provided with emergency contacts. Patient acknowledges to use resources provided if unforseen circumstances changes their current risk stratification.   Follow-up Information    Care, [place] in 5 day(s).   Why:  Next avail medications management w Rushie Chestnut NP is 12/21 at 10:40 AM.  You will be placed on a cancellation list for an earlier appointment if possible. Use walk in hours for needs prior to 12/21 - walk in 8 - 9 AM Mon, Wed, Th, Fr Contact information: [place] [address] [phone]           Plan Of Care/Follow-up recommendations:  1. Continue current psychotropic medications 2. Mental health and addiction follow up as arranged.  3. Discharge in care of their family 4. Provided limited quantity of  prescriptions   Artist [place], MD 12/20/2016, 9:32 AM

## 2016-12-20 NOTE — BHH Group Notes (Signed)
[place] Group Notes: (Clinical Social Work)   12/20/2016      Type of Therapy:  Group Therapy   Participation Level:  Did Not Attend despite MHT prompting   Selmer Dominion, LCSW 12/20/2016, 12:30 PM

## 2016-12-20 NOTE — Plan of Care (Signed)
  Progressing Safety: Periods of time without injury will increase 12/20/2016 0041 - Progressing by Karie Kirks, RN Note Pt has not harmed self or others tonight.  She denies SI/HI and verbally contracts for safety.

## 2016-12-20 NOTE — Discharge Summary (Signed)
Physician Discharge Summary Note  Patient:  Jenna Pratt is an 21 y.o., female MRN:  [id number] DOB:  14-May-1995 Patient phone:  [phone] (home)  Patient address:   [place] [address],  Total Time spent with patient: 20 minutes  Date of Admission:  12/15/2016 Date of Discharge: 12/20/16   Reason for Admission:  Worsening depression with SI  Principal Problem: Bipolar 2 disorder, major depressive episode [facility]) Discharge Diagnoses: Patient Active Problem List   Diagnosis Date Noted  . Bipolar 2 disorder, major depressive episode ([place]) [F31.81] 12/15/2016  . Cannabis use disorder, moderate, dependence ([place]) [F12.20] 12/15/2016    Past Psychiatric History: Bipolar Disorder. She has been tried on multiple agents in the past. Says she did not do well on Lithium. She was recently started on Cariprazine. She is currently on 4.5 mg daily. She is on Gabapentin for anxiety. Reports no benefit. Patient took a fatal OD in December 2017. She was hospitalized at [facility]. Patient denies any previous suicidal behavior. She has been admitted at [facility] in the past.    Past Medical History:  Past Medical History:  Diagnosis Date  . Anoxic brain injury ([place])    from previous OD   . Bipolar 1 disorder ([place])   . Depression   . Pituitary tumor   . Seizures ([place])     Past Surgical History:  Procedure Laterality Date  . TONSILLECTOMY     Family History: History reviewed. No pertinent family history. Family Psychiatric  History: Mother suffers from anxiety. She is not sure of any other family history of mental illness. She is not aware of any family history of suicide   Social History:  Social History   Substance and Sexual Activity  Alcohol Use No  . Frequency: Never     Social History   Substance and Sexual Activity  Drug Use No   Comment: denies     Social History   Socioeconomic History  . Marital status: Single    Spouse name: None  . Number  of children: None  . Years of education: None  . Highest education level: None  Social Needs  . Financial resource strain: None  . Food insecurity - worry: None  . Food insecurity - inability: None  . Transportation needs - medical: None  . Transportation needs - non-medical: None  Occupational History  . None  Tobacco Use  . Smoking status: Current Some Day Smoker    Packs/day: 0.25    Types: Cigarettes  . Smokeless tobacco: Never Used  Substance and Sexual Activity  . Alcohol use: No    Frequency: Never  . Drug use: No    Comment: denies   . Sexual activity: Yes    Birth control/protection: None    Comment: pt is not sexually active with men  Other Topics Concern  . None  Social History Narrative  . None    Hospital Course:   12/16/16 [facility] MD Assessment: 72 y.o Caucasian female, lives with her girlfriend, employed. 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 Presented to the ER involuntarily. Her mom called the sherriff and requested a wellness check on her. Family is concerned as she had a serious OD last December. Her friend also reported that patient told her she was going to kill herself on that day. Of note, patient was earlier evaluated at the ER. She had passed out in the restroom of a restaurant. Routine labs electrolyte derangement.  UDS  was positive for THC.  BAL<10 mg/dl At interview, patient reports a history of mood disorder since her early teens. Says she has been diagnosed with bipolar disorder and borderline PD. Reports having mood swings. Says she feels anxious most of the time. Reports use of THC to calm her nerves. Says she struggles to sit still, she is not able to focus. Says she has been having frequent anxiety attacks. She passed out twice lately. Says she was having a bad day the day she came in here. She was worried about her repeated syncope and felt there was no point living anymore. Says she told her girlfriend she was going to die but she  never had any intention or plan to end her own life. Says she scratched herself earlier at home. Patient odes not usually mutilate self. Says she was just overwhelmed. Says she has a pending assault and battery charge. She is due in court on 12/30/2016. Patient is currently on probation for driving undr the influence of THC. Says she is not really stressed by anniversary of her brother's death. One passed in February 04, 2005 and the other Jun 06, 2014. Both died accidentally.  Patient reports difficulty getting into sleep. Says she usually requires six pills of Melatonin or Benadryl to get into sleep. Her sleep is broken even with medication. Patient reports normal appetite. No abnormal perception. No persecutory delusion. No other form of delusion. She denies any current suicidal thoughts. No violent thoughts. No homicidal thoughts. She denies use of any synthetic drug. No other substance use. No access to weapons.   Patient remained on the [facility] unit for 4 days and stabilized with medications and therapy. Patient was continued on Vraylar 4.5 mg Daily and Gabapentin 400 mg TID. Patient was started on Depakote 1000 mg QHS and Valproic Acid level on 12/19/16 was 85. Patient has been pushing to be discharged. Patient has shown improvement with improved sleep, affect, mood, appetite, and interaction. She showed more improvement after being told she was discharging today. She states that he girlfriend will be picking her up and that she will follow up at [facility]. Patient denies any SI/HI/AVH and contracts for safety. Patient is provided with prescriptions for her medications upon discharge.      Physical Findings: AIMS: Facial and Oral Movements Muscles of Facial Expression: None, normal Lips and Perioral Area: None, normal Jaw: None, normal Tongue: None, normal,Extremity Movements Upper (arms, wrists, hands, fingers): None, normal Lower (legs, knees, ankles, toes): None, normal, Trunk  Movements Neck, shoulders, hips: None, normal, Overall Severity Severity of abnormal movements (highest score from questions above): None, normal Incapacitation due to abnormal movements: None, normal Patient's awareness of abnormal movements (rate only patient's report): No Awareness, Dental Status Current problems with teeth and/or dentures?: No Does patient usually wear dentures?: No  CIWA:    COWS:     Musculoskeletal: Strength & Muscle Tone: within normal limits Gait & Station: normal Patient leans: N/A  Psychiatric Specialty Exam: Physical Exam  Nursing note and vitals reviewed. Constitutional: She is oriented to person, place, and time. She appears well-developed and well-nourished.  Respiratory: Effort normal.  Musculoskeletal: Normal range of motion.  Neurological: She is alert and oriented to person, place, and time.  Skin: Skin is warm.    Review of Systems  Constitutional: Negative.   HENT: Negative.   Eyes: Negative.   Respiratory: Negative.   Cardiovascular: Negative.   Gastrointestinal: Negative.   Genitourinary: Negative.   Musculoskeletal: Negative.   Skin:  Negative.   Neurological: Negative.   Endo/Heme/Allergies: Negative.   Psychiatric/Behavioral: Negative.     Blood pressure 122/74, pulse (!) 110, temperature 98.3 F (36.8 C), temperature source Oral, resp. rate 14, height 5\' 2"  (1.575 m), weight 46.7 kg (103 lb).Body mass index is 18.84 kg/m.  General Appearance: Casual  Eye Contact:  Good  Speech:  Clear and Coherent and Normal Rate  Volume:  Normal  Mood:  Euthymic  Affect:  Appropriate  Thought Process:  Goal Directed and Descriptions of Associations: Intact  Orientation:  Full (Time, Place, and Person)  Thought Content:  Logical  Suicidal Thoughts:  No  Homicidal Thoughts:  No  Memory:  Immediate;   Good Recent;   Good Remote;   Good  Judgement:  Good  Insight:  Good  Psychomotor Activity:  Normal  Concentration:  Concentration: Good  and Attention Span: Good  Recall:  Good  Fund of Knowledge:  Good  Language:  Good  Akathisia:  No  Handed:  Right  AIMS (if indicated):     Assets:  Communication Skills Desire for Improvement Financial Resources/Insurance Housing Physical Health Social Support Transportation  ADL's:  Intact  Cognition:  WNL  Sleep:  Number of Hours: 6.75     Have you used any form of tobacco in the last 30 days? (Cigarettes, Smokeless Tobacco, Cigars, and/or Pipes): Yes  Has this patient used any form of tobacco in the last 30 days? (Cigarettes, Smokeless Tobacco, Cigars, and/or Pipes) Yes, Yes, A prescription for an FDA-approved tobacco cessation medication was offered at discharge and the patient refused  Blood Alcohol level:  Lab Results  Component Value Date   ETH <10 86/76/7209    Metabolic Disorder Labs:  No results found for: HGBA1C, MPG No results found for: PROLACTIN No results found for: CHOL, TRIG, HDL, CHOLHDL, VLDL, LDLCALC  See Psychiatric Specialty Exam and Suicide Risk Assessment completed by Attending Physician prior to discharge.  Discharge destination:  Home  Is patient on multiple antipsychotic therapies at discharge:  No   Has Patient had three or more failed trials of antipsychotic monotherapy by history:  No  Recommended Plan for Multiple Antipsychotic Therapies: NA   Allergies as of 12/20/2016      Reactions   Prozac [fluoxetine Hcl] Anxiety      Medication List    STOP taking these medications   dicyclomine 20 MG tablet Commonly known as:  BENTYL   ferrous sulfate 325 (65 FE) MG tablet   FOLIC ACID PO   LORazepam 1 MG tablet Commonly known as:  ATIVAN   venlafaxine 25 MG tablet Commonly known as:  EFFEXOR     TAKE these medications     Indication  Cariprazine HCl 1.5 MG Caps Take 3 capsules (4.5 mg total) by mouth at bedtime. What changed:  medication strength  Indication:  mood stability   divalproex 500 MG 24 hr tablet Commonly  known as:  DEPAKOTE ER Take 2 tablets (1,000 mg total) by mouth daily. For mood control Start taking on:  12/21/2016  Indication:  mood stability   gabapentin 400 MG capsule Commonly known as:  NEURONTIN Take 1 capsule (400 mg total) by mouth 3 (three) times daily. For agitation What changed:    medication strength  how much to take  when to take this  reasons to take this  additional instructions  Indication:  Aggressive Behavior, Trouble Sleeping, Social Anxiety Disorder   hydrOXYzine 25 MG tablet Commonly known as:  ATARAX/VISTARIL Take 1  tablet (25 mg total) by mouth 3 (three) times daily.  Indication:  Feeling Anxious   traZODone 50 MG tablet Commonly known as:  DESYREL Take 1 tablet (50 mg total) by mouth at bedtime as needed for sleep.  Indication:  Trouble Sleeping      Follow-up Information    Care, [place] in 5 day(s).   Why:  Next avail medications management w Rushie Chestnut NP is 12/21 at 10:40 AM.  You will be placed on a cancellation list for an earlier appointment if possible. Use walk in hours for needs prior to 12/21 - walk in 8 - 9 AM Mon, Wed, Th, Fr Contact information: [place] [address] [phone]           Follow-up recommendations:  Continue activity as tolerated. Continue diet as recommended by your PCP. Ensure to keep all appointments with outpatient providers.  Comments:  Patient is instructed prior to discharge to: Take all medications as prescribed by his/her mental healthcare provider. Report any adverse effects and or reactions from the medicines to his/her outpatient provider promptly. Patient has been instructed & cautioned: To not engage in alcohol and or illegal drug use while on prescription medicines. In the event of worsening symptoms, patient is instructed to call the crisis hotline, 911 and or go to the nearest ED for appropriate evaluation and treatment of symptoms. To follow-up with  his/her primary care provider for your other medical issues, concerns and or health care needs.    Signed: Lowry Ram Blaine Hari, FNP 12/20/2016, 9:45 AM

## 2016-12-20 NOTE — Progress Notes (Signed)
  [facility] Adult Case Management Discharge Plan :  Will you be returning to the same living situation after discharge:  Yes,  with roommates At discharge, do you have transportation home?: Yes,  girlfriend Do you have the ability to pay for your medications: Yes,  no barriers identified  Release of information consent forms completed and turned in to Medical Records by CSW.  Patient to Follow up at: Follow-up Information    Care, [place] in 5 day(s).   Why:  Next avail medications management w Rushie Chestnut NP is 12/21 at 10:40 AM.  You will be placed on a cancellation list for an earlier appointment if possible. Use walk in hours for needs prior to 12/21 - walk in 8 - 9 AM Mon, Wed, Th, Fr Contact information: [place] [address] [phone]           Next level of care provider has access to [place] and Suicide Prevention discussed: Yes,  with mother  Have you used any form of tobacco in the last 30 days? (Cigarettes, Smokeless Tobacco, Cigars, and/or Pipes): Yes  Has patient been referred to the Quitline?: Patient refused referral  Patient has been referred for addiction treatment: N/A  Maretta Los, LCSW 12/20/2016, 9:43 AM

## 2016-12-20 NOTE — Progress Notes (Signed)
D: Pt was in the dayroom upon initial approach.  Pt presents with depressed affect and mood.  She describes her day as "great."  Pt reports her goal was to "work on my discharge plan, I'm leaving tomorrow."  Pt reports she feels safe to discharge tomorrow and she plans to "go home and take a shower, see a therapist, take my medications and go to my appointments" after discharge.  Pt denies SI/HI, denies hallucinations, denies pain.  Pt has been visible in milieu interacting with peers and staff appropriately.  Pt attended evening group.    A: Introduced self to pt.  Actively listened to pt and offered support and encouragement. Medication administered per order.  PRN medication administered for sleep.  Q15 minute safety checks maintained.  R: Pt is safe on the unit.  Pt is compliant with medications.  Pt verbally contracts for safety.  Will continue to monitor and assess.

## 2017-09-18 ENCOUNTER — Ambulatory Visit: Payer: Medicaid Other | Admitting: Internal Medicine

## 2017-10-05 ENCOUNTER — Other Ambulatory Visit (HOSPITAL_COMMUNITY)
Admission: RE | Admit: 2017-10-05 | Discharge: 2017-10-05 | Disposition: A | Discharge status: Home / Self Care | Payer: Medicaid Other | Source: Ambulatory Visit | Attending: Internal Medicine | Admitting: Internal Medicine

## 2017-10-05 ENCOUNTER — Ambulatory Visit (INDEPENDENT_AMBULATORY_CARE_PROVIDER_SITE_OTHER): Payer: Medicaid Other | Admitting: Internal Medicine

## 2017-10-05 ENCOUNTER — Encounter: Payer: Self-pay | Admitting: Internal Medicine

## 2017-10-05 VITALS — BP 98/68 | HR 78 | Ht 62.0 in | Wt 122.0 lb

## 2017-10-05 DIAGNOSIS — Z202 Contact with and (suspected) exposure to infections with a predominantly sexual mode of transmission: Secondary | ICD-10-CM

## 2017-10-05 DIAGNOSIS — A749 Chlamydial infection, unspecified: Secondary | ICD-10-CM | POA: Diagnosis not present

## 2017-10-05 DIAGNOSIS — F3181 Bipolar II disorder: Secondary | ICD-10-CM | POA: Diagnosis not present

## 2017-10-05 DIAGNOSIS — F1220 Cannabis dependence, uncomplicated: Secondary | ICD-10-CM

## 2017-10-05 DIAGNOSIS — F17200 Nicotine dependence, unspecified, uncomplicated: Secondary | ICD-10-CM | POA: Insufficient documentation

## 2017-10-05 MED ORDER — CIPROFLOXACIN HCL 500 MG PO TABS: 500.0000 mg | ORAL_TABLET | Freq: Once | ORAL | 0 refills | Status: AC

## 2017-10-05 MED ORDER — AZITHROMYCIN 500 MG PO TABS: 1000.0000 mg | ORAL_TABLET | Freq: Every day | ORAL | 0 refills | Status: DC

## 2017-10-05 NOTE — Progress Notes (Signed)
Date:  10/05/2017   Name:  Jenna Pratt   DOB:  1995-10-15   MRN:  [id number]   Chief Complaint: Establish Care and std testing (Sexual partner said they had gonnorhea and wanted to be tested.)  Exposure to STD   The patient's pertinent negatives include no discharge, dyspareunia or dysuria. This is a new problem. Pertinent negatives include no abdominal pain, fever or sore throat. Risk factors include multiple sexual partners (ex female partner dx'd with Gonorrhea).  She has not had sexual relations with him since early August.  She is currently sexually active with several others, all female. She has not had HIV testing.  Bipolar disorder - on multiple medications, followed by Dr. Dorann Ou at [facility].  She believes that she is doing well.  She has had several suicide attempts via overdose.  Once she was comatose for 6 days and as a result has some mild cognitive impairment.   Review of Systems  Constitutional: Negative for chills, fatigue and fever.  HENT: Positive for postnasal drip. Negative for ear pain, sinus pressure, sore throat, trouble swallowing and voice change.   Respiratory: Positive for cough. Negative for chest tightness, shortness of breath and wheezing.   Cardiovascular: Negative for chest pain and palpitations.  Gastrointestinal: Negative for abdominal pain, constipation and diarrhea.  Genitourinary: Negative for dyspareunia, dysuria, genital sores, menstrual problem and vaginal discharge.  Neurological: Negative for dizziness and headaches.  Psychiatric/Behavioral: Negative for decreased concentration, sleep disturbance and suicidal ideas.    Patient Active Problem List   Diagnosis Date Noted  . Bipolar 2 disorder, major depressive episode ([place]) 12/15/2016  . Cannabis use disorder, moderate, dependence ([place]) 12/15/2016    Allergies  Allergen Reactions  . Prozac [Fluoxetine Hcl] Anxiety    Past Surgical History:  Procedure Laterality Date  . TONSILLECTOMY       Social History   Tobacco Use  . Smoking status: Current Some Day Smoker    Packs/day: 0.50    Years: 5.00    Pack years: 2.50    Types: Cigarettes    Start date: 02/05/2012  . Smokeless tobacco: Never Used  Substance Use Topics  . Alcohol use: Yes    Frequency: Never    Comment: once weekly  . Drug use: Yes    Types: Cocaine, Marijuana     Medication list has been reviewed and updated.  Current Meds  Medication Sig  . Cariprazine HCl 1.5 MG CAPS Take 3 capsules (4.5 mg total) by mouth at bedtime.  . divalproex (DEPAKOTE ER) 500 MG 24 hr tablet Take 2 tablets (1,000 mg total) by mouth daily. For mood control  . gabapentin (NEURONTIN) 400 MG capsule Take 1 capsule (400 mg total) by mouth 3 (three) times daily. For agitation  . hydrOXYzine (ATARAX/VISTARIL) 25 MG tablet Take 1 tablet (25 mg total) by mouth 3 (three) times daily.  . traZODone (DESYREL) 50 MG tablet Take 1 tablet (50 mg total) by mouth at bedtime as needed for sleep.    PHQ 2/9 Scores 10/05/2017  PHQ - 2 Score 0    Physical Exam  Constitutional: She is oriented to person, place, and time. She appears well-developed. No distress.  HENT:  Head: Normocephalic and atraumatic.  Right Ear: Tympanic membrane and ear canal normal.  Left Ear: Tympanic membrane and ear canal normal.  Nose: Right sinus exhibits no maxillary sinus tenderness. Left sinus exhibits no maxillary sinus tenderness.  Mouth/Throat: No posterior oropharyngeal edema or posterior oropharyngeal erythema.  Neck: Normal range of motion. Neck supple.  Cardiovascular: Normal rate, regular rhythm and normal heart sounds.  Pulmonary/Chest: Effort normal and breath sounds normal. No respiratory distress. She has no wheezes. She has no rales.  Abdominal: Soft. Normal appearance and bowel sounds are normal. There is no hepatosplenomegaly. There is tenderness in the suprapubic area. There is no rigidity.  Musculoskeletal: Normal range of motion.   Lymphadenopathy:    She has no cervical adenopathy.  Neurological: She is alert and oriented to person, place, and time.  Skin: Skin is warm and dry. No rash noted.  Psychiatric: She has a normal mood and affect. Her speech is normal and behavior is normal. Thought content normal.  Nursing note and vitals reviewed.   BP 98/68 (BP Location: Right Arm, Patient Position: Sitting, Cuff Size: Normal)   Pulse 78   Ht 5\' 2"  (1.575 m)   Wt 122 lb (55.3 kg)   SpO2 99%   BMI 22.31 kg/m   Assessment and Plan: 1. Exposure to sexually transmitted disease (STD) Will treat presumptively and notify of HIV results - ciprofloxacin (CIPRO) 500 MG tablet; Take 1 tablet (500 mg total) by mouth once for 1 dose.  Dispense: 1 tablet; Refill: 0 - azithromycin (ZITHROMAX) 500 MG tablet; Take 2 tablets (1,000 mg total) by mouth daily.  Dispense: 2 tablet; Refill: 0 - GC/Chlamydia probe amp ([place])not at [facility] - HIV antibody  2. Bipolar II disorder ([place]) Controlled on current medications Continue follow up with CBC  3. Tobacco use disorder Pt encouraged to cut back Current cough exacerbated by smoking  4. Cannabis dependence, uncomplicated ([place])   Meds ordered this encounter  Medications  . ciprofloxacin (CIPRO) 500 MG tablet    Sig: Take 1 tablet (500 mg total) by mouth once for 1 dose.    Dispense:  1 tablet    Refill:  0  . azithromycin (ZITHROMAX) 500 MG tablet    Sig: Take 2 tablets (1,000 mg total) by mouth daily.    Dispense:  2 tablet    Refill:  0    Partially dictated using Editor, commissioning. Any errors are unintentional.  Halina Maidens, MD [facility]  10/05/2017

## 2017-10-06 LAB — HIV ANTIBODY (ROUTINE TESTING W REFLEX): HIV Screen 4th Generation wRfx: NONREACTIVE

## 2017-10-07 LAB — GC/CHLAMYDIA PROBE AMP (~~LOC~~) NOT AT ARMC
CHLAMYDIA, DNA PROBE: POSITIVE — AB
Neisseria Gonorrhea: NEGATIVE

## 2017-10-22 ENCOUNTER — Encounter: Payer: Self-pay | Admitting: Emergency Medicine

## 2017-10-22 ENCOUNTER — Other Ambulatory Visit: Payer: Self-pay

## 2017-10-22 DIAGNOSIS — S060X0A Concussion without loss of consciousness, initial encounter: Secondary | ICD-10-CM | POA: Diagnosis not present

## 2017-10-22 DIAGNOSIS — M542 Cervicalgia: Secondary | ICD-10-CM | POA: Diagnosis not present

## 2017-10-22 DIAGNOSIS — Z79899 Other long term (current) drug therapy: Secondary | ICD-10-CM | POA: Diagnosis not present

## 2017-10-22 DIAGNOSIS — Y9389 Activity, other specified: Secondary | ICD-10-CM | POA: Diagnosis not present

## 2017-10-22 DIAGNOSIS — F17210 Nicotine dependence, cigarettes, uncomplicated: Secondary | ICD-10-CM | POA: Diagnosis not present

## 2017-10-22 DIAGNOSIS — Y92410 Unspecified street and highway as the place of occurrence of the external cause: Secondary | ICD-10-CM | POA: Diagnosis not present

## 2017-10-22 DIAGNOSIS — Y999 Unspecified external cause status: Secondary | ICD-10-CM | POA: Diagnosis not present

## 2017-10-22 DIAGNOSIS — S0990XA Unspecified injury of head, initial encounter: Secondary | ICD-10-CM | POA: Diagnosis present
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
## 2017-10-23 ENCOUNTER — Emergency Department: Payer: No Typology Code available for payment source

## 2017-10-23 ENCOUNTER — Emergency Department
Admission: EM | Admit: 2017-10-23 | Discharge: 2017-10-23 | Disposition: A | Discharge status: Home / Self Care | Payer: No Typology Code available for payment source | Attending: Emergency Medicine | Admitting: Emergency Medicine

## 2017-10-23 DIAGNOSIS — S060X0A Concussion without loss of consciousness, initial encounter: Secondary | ICD-10-CM

## 2017-10-23 MED ORDER — IBUPROFEN 400 MG PO TABS
400.0000 mg | ORAL_TABLET | Freq: Once | ORAL | Status: AC | PRN
  Administered 2017-10-23: 400 mg via ORAL

## 2017-10-23 MED ORDER — IBUPROFEN 400 MG PO TABS
ORAL_TABLET | ORAL | Status: AC
  Filled 2017-10-23: qty 1

## 2017-10-23 NOTE — ED Notes (Signed)
Patient given ice pack for neck per patient request.
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
## 2017-10-23 NOTE — ED Notes (Signed)
ED Provider at bedside. 
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
## 2018-04-16 ENCOUNTER — Ambulatory Visit (INDEPENDENT_AMBULATORY_CARE_PROVIDER_SITE_OTHER): Payer: Medicaid Other | Admitting: Internal Medicine

## 2018-04-16 ENCOUNTER — Other Ambulatory Visit: Payer: Self-pay

## 2018-04-16 ENCOUNTER — Encounter: Payer: Self-pay | Admitting: Internal Medicine

## 2018-04-16 VITALS — BP 128/74 | HR 67 | Temp 99.0°F | Ht 62.0 in | Wt 126.0 lb

## 2018-04-16 DIAGNOSIS — R69 Illness, unspecified: Secondary | ICD-10-CM

## 2018-04-16 DIAGNOSIS — J111 Influenza due to unidentified influenza virus with other respiratory manifestations: Secondary | ICD-10-CM

## 2018-04-16 NOTE — Progress Notes (Signed)
Date:  04/16/2018   Name:  Jenna Pratt   DOB:  Jul 16, 1995   MRN:  [id number]   Chief Complaint: Cough (Cough with SOB while walking up stairs. No travel. Fever in AMs. Started two weeks ago.  )  Cough  This is a new problem. The current episode started in the past 7 days. The problem has been unchanged. The cough is non-productive. Associated symptoms include a fever (101 but today 99), a sore throat, shortness of breath and sweats. Pertinent negatives include no chest pain, chills, rash or wheezing. She has tried nothing for the symptoms.  She continues to smoke 1 ppd as well as marijuana frequently.  She also uses cocaine.  Review of Systems  Constitutional: Positive for fever (101 but today 99). Negative for appetite change, chills, diaphoresis and unexpected weight change.  HENT: Positive for sore throat. Negative for dental problem and sinus pressure.   Eyes: Negative for visual disturbance.  Respiratory: Positive for cough and shortness of breath. Negative for choking, chest tightness and wheezing.   Cardiovascular: Negative for chest pain, palpitations and leg swelling.  Skin: Negative for color change and rash.  Psychiatric/Behavioral: Positive for dysphoric mood. Negative for sleep disturbance.    Patient Active Problem List   Diagnosis Date Noted  . Tobacco use disorder 10/05/2017  . Bipolar 2 disorder, major depressive episode ([place]) 12/15/2016  . Cannabis use disorder, moderate, dependence ([place]) 12/15/2016    Allergies  Allergen Reactions  . Prozac [Fluoxetine Hcl] Anxiety    Past Surgical History:  Procedure Laterality Date  . TONSILLECTOMY      Social History   Tobacco Use  . Smoking status: Current Some Day Smoker    Packs/day: 1.00    Years: 5.00    Pack years: 5.00    Types: Cigarettes    Start date: 02/05/2012  . Smokeless tobacco: Never Used  Substance Use Topics  . Alcohol use: Yes    Frequency: Never    Comment: once weekly  . Drug use: Yes     Types: Marijuana, Cocaine     Medication list has been reviewed and updated.  Current Meds  Medication Sig  . Cariprazine HCl 1.5 MG CAPS Take 3 capsules (4.5 mg total) by mouth at bedtime.  . divalproex (DEPAKOTE ER) 500 MG 24 hr tablet Take 2 tablets (1,000 mg total) by mouth daily. For mood control  . gabapentin (NEURONTIN) 400 MG capsule Take 1 capsule (400 mg total) by mouth 3 (three) times daily. For agitation  . hydrOXYzine (ATARAX/VISTARIL) 25 MG tablet Take 1 tablet (25 mg total) by mouth 3 (three) times daily.  . traZODone (DESYREL) 50 MG tablet Take 1 tablet (50 mg total) by mouth at bedtime as needed for sleep.    PHQ 2/9 Scores 04/16/2018 10/05/2017  PHQ - 2 Score 0 0    Physical Exam Vitals signs and nursing note reviewed.  Constitutional:      General: She is not in acute distress.    Appearance: Normal appearance. She is well-developed. She is not ill-appearing.  HENT:     Head: Normocephalic and atraumatic.     Right Ear: Tympanic membrane and ear canal normal.     Left Ear: Tympanic membrane and ear canal normal.     Nose: Nose normal.     Right Sinus: No maxillary sinus tenderness.     Left Sinus: No maxillary sinus tenderness.     Mouth/Throat:     Mouth: Mucous membranes  are moist.     Pharynx: No oropharyngeal exudate, posterior oropharyngeal erythema or uvula swelling.  Neck:     Musculoskeletal: Normal range of motion and neck supple.  Cardiovascular:     Rate and Rhythm: Normal rate and regular rhythm.     Pulses: Normal pulses.  Pulmonary:     Effort: Pulmonary effort is normal. No tachypnea, accessory muscle usage, respiratory distress or retractions.     Breath sounds: Normal breath sounds and air entry. No decreased breath sounds, wheezing, rhonchi or rales.  Musculoskeletal: Normal range of motion.  Lymphadenopathy:     Cervical: No cervical adenopathy.  Skin:    General: Skin is warm and dry.     Findings: No rash.  Neurological:      Mental Status: She is alert and oriented to person, place, and time.  Psychiatric:        Behavior: Behavior normal.        Thought Content: Thought content normal.     Wt Readings from Last 3 Encounters:  04/16/18 126 lb (57.2 kg)  10/22/17 125 lb (56.7 kg)  10/05/17 122 lb (55.3 kg)    BP 128/74   Pulse 67   Temp 99 F (37.2 C) (Oral)   Ht 5\' 2"  (1.575 m)   Wt 126 lb (57.2 kg)   SpO2 98%   BMI 23.05 kg/m   Assessment and Plan: 1. Influenza-like illness Now about one week of sx that are improving Recommend increasing fluids Tylenol 1000 mg tid as needed for fever   Partially dictated using Editor, commissioning. Any errors are unintentional.  Halina Maidens, MD [facility]  04/16/2018

## 2018-04-16 NOTE — Patient Instructions (Signed)
Increase your fluids - 80-100 oz per day, every day.  Tylenol 500 mg 2 tablets three times a day until fever is gone.

## 2018-07-02 ENCOUNTER — Other Ambulatory Visit: Payer: Self-pay | Admitting: Internal Medicine

## 2018-07-02 DIAGNOSIS — F3181 Bipolar II disorder: Secondary | ICD-10-CM

## 2019-02-21 ENCOUNTER — Telehealth: Payer: Self-pay | Admitting: Internal Medicine

## 2019-02-21 NOTE — Telephone Encounter (Signed)
Pt called saying she just got a positive pregnancy test and wants to get an abortion and was told she could go to her PCP, per CMA [facility]

## 2019-02-25 ENCOUNTER — Ambulatory Visit (INDEPENDENT_AMBULATORY_CARE_PROVIDER_SITE_OTHER): Payer: Medicare Other | Admitting: Internal Medicine

## 2019-02-25 ENCOUNTER — Other Ambulatory Visit: Payer: Self-pay

## 2019-02-25 ENCOUNTER — Other Ambulatory Visit: Payer: Self-pay | Admitting: Internal Medicine

## 2019-02-25 ENCOUNTER — Encounter: Payer: Self-pay | Admitting: Internal Medicine

## 2019-02-25 VITALS — BP 112/78 | HR 112 | Temp 98.1°F | Ht 62.0 in | Wt 130.0 lb

## 2019-02-25 DIAGNOSIS — O99891 Other specified diseases and conditions complicating pregnancy: Secondary | ICD-10-CM | POA: Diagnosis not present

## 2019-02-25 DIAGNOSIS — R8271 Bacteriuria: Secondary | ICD-10-CM

## 2019-02-25 DIAGNOSIS — Z3A01 Less than 8 weeks gestation of pregnancy: Secondary | ICD-10-CM | POA: Diagnosis not present

## 2019-02-25 LAB — POC URINALYSIS WITH MICROSCOPIC (NON AUTO)MANUAL RESULT
Bilirubin, UA: NEGATIVE
Blood, UA: NEGATIVE
Crystals: 0
Glucose, UA: NEGATIVE
Ketones, UA: NEGATIVE
Mucus, UA: 0
Nitrite, UA: NEGATIVE
Protein, UA: NEGATIVE
RBC: 0 M/uL — AB (ref 4.04–5.48)
Spec Grav, UA: 1.01 (ref 1.010–1.025)
Urobilinogen, UA: 0.2 E.U./dL
WBC Casts, UA: 5
pH, UA: 6 (ref 5.0–8.0)

## 2019-02-25 LAB — POCT URINE PREGNANCY: Preg Test, Ur: POSITIVE — AB

## 2019-02-25 MED ORDER — PRENATAL ADULT GUMMY/DHA/FA 0.4-25 MG PO CHEW: 1.0000 | CHEWABLE_TABLET | Freq: Every day | ORAL | 1 refills | Status: DC

## 2019-02-25 NOTE — Progress Notes (Signed)
Date:  02/25/2019   Name:  Jenna Pratt   DOB:  [birth year]   MRN:  [id number]   Chief Complaint: Amenorrhea (Patient got a positive pregnancy test at home a few days ago. Wants to confirm and be referred to GYN for this. First test was the 24th- has had 5 positive tests since then.) She is feeling well, had some gagging this am with brushing her teeth.  Otherwise no problems.  No vaginal bleeding or cramping.  She wants to see OB in [facility] where she will be moving. She has stopped all alcohol, tobacco and illicit drugs.  She has not taken any of her psych medications in over one year. She is taking a flintstones vitamin daily.  HPI  Lab Results  Component Value Date   CREATININE 0.71 12/14/2016   BUN 10 12/14/2016   NA 140 12/14/2016   K 3.4 (L) 12/14/2016   CL 111 12/14/2016   CO2 23 12/14/2016    Review of Systems  Constitutional: Negative for chills, fatigue, fever and unexpected weight change.  Respiratory: Negative for cough, chest tightness and shortness of breath.   Cardiovascular: Negative for chest pain and palpitations.  Gastrointestinal: Positive for vomiting (one episode). Negative for abdominal pain and nausea.  Genitourinary: Negative for dysuria, hematuria and pelvic pain.  Musculoskeletal: Negative for arthralgias.  Skin: Negative for rash.  Neurological: Negative for dizziness, light-headedness and headaches.  Psychiatric/Behavioral: Negative for dysphoric mood and sleep disturbance. The patient is not nervous/anxious.     Patient Active Problem List   Diagnosis Date Noted  . Tobacco use disorder 10/05/2017  . Bipolar 2 disorder, major depressive episode ([place]) 12/15/2016  . Cannabis use disorder, moderate, dependence ([place]) 12/15/2016    Allergies  Allergen Reactions  . Prozac [Fluoxetine Hcl] Anxiety    Past Surgical History:  Procedure Laterality Date  . TONSILLECTOMY      Social History   Tobacco Use  . Smoking status: Former Smoker     Packs/day: 1.00    Years: 5.00    Pack years: 5.00    Types: Cigarettes    Start date: 02/05/2012    Quit date: 02/21/2019    Years since quitting: 0.0  . Smokeless tobacco: Never Used  Substance Use Topics  . Alcohol use: Not Currently    Comment: once weekly  . Drug use: Not Currently    Types: Marijuana, Cocaine     Medication list has been reviewed and updated.  No outpatient medications have been marked as taking for the 02/25/19 encounter (Office Visit) with Glean Hess, MD.    [facility] 2/9 Scores 02/25/2019 04/16/2018 10/05/2017  PHQ - 2 Score 0 0 0    BP Readings from Last 3 Encounters:  02/25/19 112/78  04/16/18 128/74  10/23/17 126/70    Physical Exam Vitals and nursing note reviewed.  Constitutional:      General: She is not in acute distress.    Appearance: Normal appearance. She is well-developed.  HENT:     Head: Normocephalic and atraumatic.  Neck:     Vascular: No carotid bruit.  Cardiovascular:     Rate and Rhythm: Normal rate and regular rhythm.     Pulses: Normal pulses.     Heart sounds: No murmur.  Pulmonary:     Effort: Pulmonary effort is normal. No respiratory distress.     Breath sounds: No wheezing or rhonchi.  Abdominal:     General: Abdomen is flat. There is no  distension.     Palpations: Abdomen is soft.     Tenderness: There is no abdominal tenderness. There is no guarding or rebound.  Musculoskeletal:     Right lower leg: No edema.     Left lower leg: No edema.  Lymphadenopathy:     Cervical: No cervical adenopathy.  Skin:    General: Skin is warm and dry.     Findings: No rash.  Neurological:     General: No focal deficit present.     Mental Status: She is alert and oriented to person, place, and time.  Psychiatric:        Behavior: Behavior normal.        Thought Content: Thought content normal.     Wt Readings from Last 3 Encounters:  02/25/19 130 lb (59 kg)  04/16/18 126 lb (57.2 kg)  10/22/17 125 lb (56.7 kg)     BP 112/78   Pulse (!) 112   Temp 98.1 F (36.7 C) (Oral)   Ht 5\' 2"  (1.575 m)   Wt 130 lb (59 kg)   LMP 01/17/2019 (Exact Date)   SpO2 98%   BMI 23.78 kg/m   Assessment and Plan: 1. Less than [redacted] weeks gestation of pregnancy EDD 10/24/2019  [redacted]w[redacted]d EGA She will begin prenatal vitamins and will refer to OB in [facility] Abstain from all alcohol, tobacco and drugs - Ambulatory referral to Gynecology - POCT urine pregnancy - Prenatal MV & Min w/FA-DHA (PRENATAL ADULT GUMMY/DHA/FA) 0.4-25 MG CHEW; Chew 1 each by mouth daily.  Dispense: 90 tablet; Refill: 1  2. Bacteriuria during pregnancy in first trimester Will send for culture and treat as indicated - POC urinalysis w microscopic (non auto) - Urine Culture   Partially dictated using Editor, commissioning. Any errors are unintentional.  Halina Maidens, MD [facility]  02/25/2019

## 2019-02-28 LAB — URINE CULTURE: Organism ID, Bacteria: NO GROWTH

## 2020-06-21 ENCOUNTER — Other Ambulatory Visit: Payer: Self-pay

## 2020-06-21 ENCOUNTER — Ambulatory Visit (INDEPENDENT_AMBULATORY_CARE_PROVIDER_SITE_OTHER): Payer: Medicare Other | Admitting: Internal Medicine

## 2020-06-21 ENCOUNTER — Encounter: Payer: Self-pay | Admitting: Internal Medicine

## 2020-06-21 ENCOUNTER — Other Ambulatory Visit (HOSPITAL_COMMUNITY)
Admission: RE | Admit: 2020-06-21 | Discharge: 2020-06-21 | Disposition: A | Discharge status: Home / Self Care | Payer: Medicare Other | Source: Ambulatory Visit | Attending: Internal Medicine | Admitting: Internal Medicine

## 2020-06-21 VITALS — BP 106/74 | HR 110 | Temp 97.7°F | Ht 62.0 in | Wt 107.0 lb

## 2020-06-21 DIAGNOSIS — Z124 Encounter for screening for malignant neoplasm of cervix: Secondary | ICD-10-CM

## 2020-06-21 DIAGNOSIS — Z1151 Encounter for screening for human papillomavirus (HPV): Secondary | ICD-10-CM | POA: Insufficient documentation

## 2020-06-21 DIAGNOSIS — Z0000 Encounter for general adult medical examination without abnormal findings: Secondary | ICD-10-CM | POA: Diagnosis not present

## 2020-06-21 DIAGNOSIS — Z114 Encounter for screening for human immunodeficiency virus [HIV]: Secondary | ICD-10-CM

## 2020-06-21 DIAGNOSIS — Z1159 Encounter for screening for other viral diseases: Secondary | ICD-10-CM

## 2020-06-21 DIAGNOSIS — G43111 Migraine with aura, intractable, with status migrainosus: Secondary | ICD-10-CM | POA: Diagnosis not present

## 2020-06-21 NOTE — Progress Notes (Signed)
Date:  06/21/2020   Name:  Jenna Pratt   DOB:  07/31/1995   MRN:  [id number]   Chief Complaint: Annual Exam (Breast exam with pap) and std testing Jenna Pratt is a 25 y.o. female who presents today for her Complete Annual Exam. She feels well. She reports exercising gym every day. She reports she is sleeping fairly well. Breast complaints none. She isexually active in a same sex relationship.  Her previous relationship during which she became pregnant was with a gender fluid biologic female.  Pap smear: none  There is no immunization history on file for this patient.  Migraine  This is a recurrent problem. The problem occurs intermittently. The pain is located in the occipital region. The pain does not radiate. Associated symptoms include muscle aches, nausea and photophobia. Pertinent negatives include no abdominal pain, coughing, dizziness, fever, hearing loss, tinnitus or vomiting. She has tried NSAIDs for the symptoms.    Lab Results  Component Value Date   CREATININE 0.71 12/14/2016   BUN 10 12/14/2016   NA 140 12/14/2016   K 3.4 (L) 12/14/2016   CL 111 12/14/2016   CO2 23 12/14/2016   No results found for: CHOL, HDL, LDLCALC, LDLDIRECT, TRIG, CHOLHDL No results found for: TSH No results found for: HGBA1C Lab Results  Component Value Date   WBC 8.0 12/14/2016   HGB 13.5 12/14/2016   HCT 39.0 12/14/2016   MCV 90.9 12/14/2016   PLT 223 12/14/2016   Lab Results  Component Value Date   ALT 10 (L) 12/14/2016   AST 18 12/14/2016   ALKPHOS 52 12/14/2016   BILITOT 0.5 12/14/2016     Review of Systems  Constitutional: Negative for chills, fatigue and fever.  HENT: Negative for congestion, hearing loss, tinnitus, trouble swallowing and voice change.   Eyes: Positive for photophobia. Negative for visual disturbance.  Respiratory: Negative for cough, chest tightness, shortness of breath and wheezing.   Cardiovascular: Negative for chest pain, palpitations and leg  swelling.  Gastrointestinal: Positive for nausea. Negative for abdominal pain, constipation, diarrhea and vomiting.  Endocrine: Negative for polydipsia and polyuria.  Genitourinary: Negative for dysuria, frequency, genital sores, vaginal bleeding and vaginal discharge.  Musculoskeletal: Negative for arthralgias, gait problem and joint swelling.  Skin: Negative for color change and rash.  Neurological: Positive for headaches. Negative for dizziness, tremors and light-headedness.  Hematological: Negative for adenopathy. Does not bruise/bleed easily.  Psychiatric/Behavioral: Negative for dysphoric mood and sleep disturbance. The patient is not nervous/anxious.     Patient Active Problem List   Diagnosis Date Noted  . Tobacco use disorder 10/05/2017  . Bipolar 2 disorder, major depressive episode ([place]) 12/15/2016  . Cannabis use disorder, moderate, dependence ([place]) 12/15/2016    Allergies  Allergen Reactions  . Prozac [Fluoxetine Hcl] Anxiety    Past Surgical History:  Procedure Laterality Date  . TONSILLECTOMY      Social History   Tobacco Use  . Smoking status: Former Smoker    Packs/day: 1.00    Years: 5.00    Pack years: 5.00    Types: Cigarettes    Start date: 02/05/2012    Quit date: 02/21/2019    Years since quitting: 1.3  . Smokeless tobacco: Never Used  Vaping Use  . Vaping Use: Former  Substance Use Topics  . Alcohol use: Not Currently    Comment: once weekly  . Drug use: Not Currently    Types: Marijuana, Cocaine     Medication  list has been reviewed and updated.  No outpatient medications have been marked as taking for the 06/21/20 encounter (Office Visit) with Glean Hess, MD.    [facility] 2/9 Scores 02/25/2019 04/16/2018 10/05/2017  PHQ - 2 Score 0 0 0    No flowsheet data found.  BP Readings from Last 3 Encounters:  06/21/20 106/74  02/25/19 112/78  04/16/18 128/74    Physical Exam Vitals and nursing note reviewed.  Constitutional:       General: She is not in acute distress.    Appearance: She is well-developed.  HENT:     Head: Normocephalic and atraumatic.     Right Ear: Tympanic membrane and ear canal normal.     Left Ear: Tympanic membrane and ear canal normal.     Nose:     Right Sinus: No maxillary sinus tenderness.     Left Sinus: No maxillary sinus tenderness.  Eyes:     General: No scleral icterus.       Right eye: No discharge.        Left eye: No discharge.     Conjunctiva/sclera: Conjunctivae normal.  Neck:     Thyroid: No thyromegaly.     Vascular: No carotid bruit.  Cardiovascular:     Rate and Rhythm: Normal rate and regular rhythm.     Pulses: Normal pulses.     Heart sounds: Normal heart sounds.  Pulmonary:     Effort: Pulmonary effort is normal. No respiratory distress.     Breath sounds: No wheezing.  Chest:  Breasts:     Right: No mass, nipple discharge, skin change or tenderness.     Left: No mass, nipple discharge, skin change or tenderness.    Abdominal:     General: Bowel sounds are normal.     Palpations: Abdomen is soft.     Tenderness: There is no abdominal tenderness.  Genitourinary:    Labia:        Right: No tenderness, lesion or injury.        Left: No tenderness, lesion or injury.      Vagina: Normal.     Cervix: No discharge, friability or erythema.     Uterus: Normal.      Adnexa: Right adnexa normal and left adnexa normal.  Musculoskeletal:     Cervical back: Normal range of motion. No erythema.     Right lower leg: No edema.     Left lower leg: No edema.  Lymphadenopathy:     Cervical: No cervical adenopathy.     Lower Body: No right inguinal adenopathy. No left inguinal adenopathy.  Skin:    General: Skin is warm and dry.     Findings: No rash.  Neurological:     Mental Status: She is alert and oriented to person, place, and time.     Cranial Nerves: No cranial nerve deficit.     Sensory: No sensory deficit.     Deep Tendon Reflexes: Reflexes are normal and  symmetric.  Psychiatric:        Attention and Perception: Attention normal.        Mood and Affect: Mood normal.     Wt Readings from Last 3 Encounters:  06/21/20 107 lb (48.5 kg)  02/25/19 130 lb (59 kg)  04/16/18 126 lb (57.2 kg)    BP 106/74   Pulse (!) 110   Temp 97.7 F (36.5 C) (Oral)   Ht 5\' 2"  (1.575 m)   Wt 107 lb (  48.5 kg)   LMP 06/12/2020   SpO2 97%   Breastfeeding Unknown   BMI 19.57 kg/m   Assessment and Plan: 1. Annual physical exam Normal exam; continue healthy diet, exercise - Comprehensive metabolic panel - Lipid panel - TSH  2. Need for hepatitis C screening test - Hepatitis C antibody  3. Encounter for screening for cervical cancer Pap obtained with GC/chlamydia testing - Cytology - PAP  4. Encounter for screening for HIV - HIV Antibody (routine testing w rflx)  5. Intractable migraine with aura with status migrainosus Hx of closed head injury; now with new headaches that are migrainous in nature Recommend trial of Excedrin Migraine - I would avoid triptans Refer to neurology - CBC with Differential/Platelet - Ambulatory referral to Neurology   Partially dictated using [place]. Any errors are unintentional.  Halina Maidens, MD [facility]  06/21/2020

## 2020-06-21 NOTE — Patient Instructions (Signed)
Use Excedrin migraine as needed for headaches

## 2020-06-22 LAB — CYTOLOGY - PAP
Chlamydia: NEGATIVE
Comment: NEGATIVE
Comment: NEGATIVE
Comment: NORMAL
Diagnosis: NEGATIVE
High risk HPV: NEGATIVE
Neisseria Gonorrhea: NEGATIVE

## 2020-06-22 LAB — CBC WITH DIFFERENTIAL/PLATELET
Basophils Absolute: 0.1 10*3/uL (ref 0.0–0.2)
Basos: 1 %
EOS (ABSOLUTE): 0.1 10*3/uL (ref 0.0–0.4)
Eos: 2 %
Hematocrit: 42.6 % (ref 34.0–46.6)
Hemoglobin: 14.1 g/dL (ref 11.1–15.9)
Immature Grans (Abs): 0 10*3/uL (ref 0.0–0.1)
Immature Granulocytes: 0 %
Lymphocytes Absolute: 2.4 10*3/uL (ref 0.7–3.1)
Lymphs: 43 %
MCH: 31.1 pg (ref 26.6–33.0)
MCHC: 33.1 g/dL (ref 31.5–35.7)
MCV: 94 fL (ref 79–97)
Monocytes Absolute: 0.5 10*3/uL (ref 0.1–0.9)
Monocytes: 8 %
Neutrophils Absolute: 2.6 10*3/uL (ref 1.4–7.0)
Neutrophils: 46 %
Platelets: 275 10*3/uL (ref 150–450)
RBC: 4.54 x10E6/uL (ref 3.77–5.28)
RDW: 12.9 % (ref 11.7–15.4)
WBC: 5.6 10*3/uL (ref 3.4–10.8)

## 2020-06-22 LAB — COMPREHENSIVE METABOLIC PANEL
ALT: 11 IU/L (ref 0–32)
AST: 15 IU/L (ref 0–40)
Albumin/Globulin Ratio: 2.4 — ABNORMAL HIGH (ref 1.2–2.2)
Albumin: 4.8 g/dL (ref 3.9–5.0)
Alkaline Phosphatase: 51 IU/L (ref 44–121)
BUN/Creatinine Ratio: 14 (ref 9–23)
BUN: 13 mg/dL (ref 6–20)
Bilirubin Total: 0.5 mg/dL (ref 0.0–1.2)
CO2: 22 mmol/L (ref 20–29)
Calcium: 10.1 mg/dL (ref 8.7–10.2)
Chloride: 103 mmol/L (ref 96–106)
Creatinine, Ser: 0.93 mg/dL (ref 0.57–1.00)
Globulin, Total: 2 g/dL (ref 1.5–4.5)
Glucose: 87 mg/dL (ref 65–99)
Potassium: 4.2 mmol/L (ref 3.5–5.2)
Sodium: 139 mmol/L (ref 134–144)
Total Protein: 6.8 g/dL (ref 6.0–8.5)
eGFR: 89 mL/min/{1.73_m2} (ref >59)

## 2020-06-22 LAB — LIPID PANEL
Chol/HDL Ratio: 2.2 ratio (ref 0.0–4.4)
Cholesterol, Total: 153 mg/dL (ref 100–199)
HDL: 69 mg/dL (ref >39)
LDL Chol Calc (NIH): 72 mg/dL (ref 0–99)
Triglycerides: 57 mg/dL (ref 0–149)
VLDL Cholesterol Cal: 12 mg/dL (ref 5–40)

## 2020-06-22 LAB — HIV ANTIBODY (ROUTINE TESTING W REFLEX): HIV Screen 4th Generation wRfx: NONREACTIVE

## 2020-06-22 LAB — HEPATITIS C ANTIBODY: Hep C Virus Ab: <0.1 s/co ratio (ref 0.0–0.9)

## 2020-06-22 LAB — TSH: TSH: 2.33 u[IU]/mL (ref 0.450–4.500)

## 2020-06-22 NOTE — Addendum Note (Signed)
Addended by: Glean Hess on: 06/22/2020 08:42 AM   Modules accepted: Level of Service

## 2020-07-02 ENCOUNTER — Telehealth: Payer: Self-pay | Admitting: *Deleted

## 2020-07-02 NOTE — Telephone Encounter (Signed)
Patient called back regarding lab results- Notified: results normal. Pap smear is normal. HPV is negative. STD testing is negative. Yeast are seen but do not need to be treated unless there are symptoms.

## 2020-09-12 ENCOUNTER — Telehealth: Payer: Self-pay | Admitting: Internal Medicine

## 2020-09-12 NOTE — Telephone Encounter (Signed)
Copied from [place] [id number]. Topic: Medicare AWV >> Sep 12, 2020  2:28 PM Cher Nakai R wrote: Reason for CRM:  Phone answers but then hangs up unable to leave a message for patient to call back and schedule Medicare Annual Wellness Visit (AWV) in office.   If unable to come into the office for AWV,  please offer to do virtually or by telephone.  No hx of AWV eligible for AWVI as of  08/28/2019  Please schedule at anytime with [facility] Health Advisor.      40 Minutes appointment   Any questions, please call me at [phone]

## 2020-09-19 ENCOUNTER — Other Ambulatory Visit: Payer: Self-pay | Admitting: Physician Assistant

## 2020-09-19 DIAGNOSIS — R519 Headache, unspecified: Secondary | ICD-10-CM

## 2020-10-25 ENCOUNTER — Ambulatory Visit
Admission: RE | Admit: 2020-10-25 | Discharge: 2020-10-25 | Disposition: A | Discharge status: Home / Self Care | Payer: Medicare Other | Source: Ambulatory Visit | Attending: Physician Assistant | Admitting: Physician Assistant

## 2020-10-25 ENCOUNTER — Other Ambulatory Visit: Payer: Self-pay

## 2020-10-25 DIAGNOSIS — R519 Headache, unspecified: Secondary | ICD-10-CM | POA: Diagnosis present

## 2020-10-25 MED ORDER — GADOBUTROL 1 MMOL/ML IV SOLN
5.0000 mL | Freq: Once | INTRAVENOUS | Status: AC | PRN
  Administered 2020-10-25: 5 mL via INTRAVENOUS

## 2021-03-13 ENCOUNTER — Ambulatory Visit: Payer: Medicare Other

## 2021-03-13 ENCOUNTER — Telehealth: Payer: Self-pay | Admitting: Internal Medicine

## 2021-03-13 NOTE — Telephone Encounter (Signed)
Copied from [place] [phone]. Topic: Medicare AWV >> Mar 13, 2021  3:09 PM Cher Nakai R wrote: Reason for CRM:  Need to reschedule AWV- [facility] had to leave early- Spoke w/patient but kept losing signal/connection and was not able to give new appt date.

## 2021-04-01 ENCOUNTER — Ambulatory Visit (INDEPENDENT_AMBULATORY_CARE_PROVIDER_SITE_OTHER): Payer: Medicare Other

## 2021-04-01 DIAGNOSIS — Z0000 Encounter for general adult medical examination without abnormal findings: Secondary | ICD-10-CM | POA: Diagnosis not present

## 2021-04-01 NOTE — Progress Notes (Signed)
Subjective:   Jenna Pratt is a 26 y.o. female who presents for an Initial Medicare Annual Wellness Visit.  Virtual Visit via Telephone Note  I connected with  Jenna Pratt on 04/01/21 at  2:40 PM EST by telephone and verified that I am speaking with the correct person using two identifiers.  Location: Patient: home Provider: [facility] Persons participating in the virtual visit: [place]   I discussed the limitations, risks, security and privacy concerns of performing an evaluation and management service by telephone and the availability of in person appointments. The patient expressed understanding and agreed to proceed.  Interactive audio and video telecommunications were attempted between this nurse and patient, however failed, due to patient having technical difficulties OR patient did not have access to video capability.  We continued and completed visit with audio only.  Some vital signs may be absent or patient reported.   Clemetine Marker, LPN   Review of Systems     Cardiac Risk Factors include: smoking/ tobacco exposure     Objective:    There were no vitals filed for this visit. There is no height or weight on file to calculate BMI.  Advanced Directives 04/01/2021 10/22/2017 12/14/2016 12/14/2016 12/14/2016  Does Patient Have a Medical Advance Directive? No No No No No  Would patient like information on creating a medical advance directive? No - Patient declined No - Patient declined No - Patient declined No - Patient declined -  Some encounter information is confidential and restricted. Go to Review Flowsheets activity to see all data.    Current Medications (verified) Outpatient Encounter Medications as of 04/01/2021  Medication Sig   acetaminophen (TYLENOL) 500 MG tablet Take 1-2 tablets by mouth every 6 (six) hours as needed.   melatonin 3 MG TABS tablet Take by mouth.   No facility-administered encounter medications on file as of 04/01/2021.     Allergies (verified) Prozac [fluoxetine hcl] and Aripiprazole   History: Past Medical History:  Diagnosis Date   Anoxic brain injury ([place])    from previous OD    Anxiety    Bipolar 1 disorder ([place])    Depression    History of substance abuse ([place])    Pituitary tumor    Seizures ([place])    Past Surgical History:  Procedure Laterality Date   TONSILLECTOMY     History reviewed. No pertinent family history. Social History   Socioeconomic History   Marital status: Single    Spouse name: Not on file   Number of children: 0   Years of education: Not on file   Highest education level: Not on file  Occupational History   Occupation: disabled  Tobacco Use   Smoking status: Former    Packs/day: 1.00    Years: 5.00    Pack years: 5.00    Types: Cigarettes    Start date: 02/05/2012    Quit date: 02/21/2019    Years since quitting: 2.1   Smokeless tobacco: Never   Tobacco comments:    1/2 PPD 04/01/21 and vaping  Vaping Use   Vaping Use: Former  Substance and Sexual Activity   Alcohol use: Yes    Comment: twice monthly   Drug use: Not Currently    Types: Marijuana, Cocaine   Sexual activity: Yes    Partners: Female    Birth control/protection: None    Comment: pt is not sexually active with men  Other Topics Concern   Not on file  Social History  Narrative   Not on file   Social Determinants of Health   Financial Resource Strain: Low Risk    Difficulty of Paying Living Expenses: Not hard at all  Food Insecurity: No Food Insecurity   Worried About Charity fundraiser in the Last Year: Never true   [place] in the Last Year: Never true  Transportation Needs: No Transportation Needs   Lack of Transportation (Medical): No   Lack of Transportation (Non-Medical): No  Physical Activity: Not on file  Stress: Stress Concern Present   Feeling of Stress : Rather much  Social Connections: Socially Isolated   Frequency of Communication with Friends and Family: More  than three times a week   Frequency of Social Gatherings with Friends and Family: More than three times a week   Attends Religious Services: Never   Marine scientist or Organizations: No   Attends Archivist Meetings: Never   Marital Status: Never married    Tobacco Counseling Counseling given: Not Answered Tobacco comments: 1/2 PPD 04/01/21 and vaping   Clinical Intake:  Pre-visit preparation completed: Yes  Pain : No/denies pain     Nutritional Risks: None Diabetes: No  How often do you need to have someone help you when you read instructions, pamphlets, or other written materials from your doctor or pharmacy?: 1 - Never    Interpreter Needed?: No  Information entered by :: Clemetine Marker LPN   Activities of Daily Living In your present state of health, do you have any difficulty performing the following activities: 04/01/2021 06/21/2020  Hearing? N N  Vision? N Y  Difficulty concentrating or making decisions? [place] Donning  Walking or climbing stairs? N N  Dressing or bathing? N N  Doing errands, shopping? N N  Preparing Food and eating ? N -  Using the Toilet? N -  In the past six months, have you accidently leaked urine? N -  Do you have problems with loss of bowel control? N -  Managing your Medications? N -  Managing your Finances? N -  Housekeeping or managing your Housekeeping? N -  Some recent data might be hidden    Patient Care Team: Glean Hess, MD as PCP - General (Internal Medicine) Dorann Ou Montine Circle, NP as Nurse Practitioner (Psychiatry)  Indicate any recent Medical Services you may have received from other than [facility] providers in the past year (date may be approximate).     Assessment:   This is a routine wellness examination for [name].  Hearing/Vision screen Hearing Screening - Comments:: Pt denies hearing difficulty Vision Screening - Comments:: Due for vision screenings  Dietary issues and exercise activities discussed: Current  Exercise Habits: The patient has a physically strenuous job, but has no regular exercise apart from work., Exercise limited by: None identified   Goals Addressed             This Visit's Progress    DIET - INCREASE WATER INTAKE       Recommend drinking 6-8 glasses of water per day        Depression Screen PHQ 2/9 Scores 04/01/2021 06/21/2020 02/25/2019 04/16/2018 10/05/2017  PHQ - 2 Score 2 0 0 0 0  PHQ- 9 Score 11 6 - - -    Fall Risk Fall Risk  04/01/2021 06/21/2020 04/16/2018  Falls in the past year? 0 0 0  Number falls in past yr: 0 - 0  Injury with Fall? 0 0 0  Risk  for fall due to : No Fall Risks - -  Follow up Falls prevention discussed Falls evaluation completed Falls evaluation completed    [place]:  Any stairs in or around the home? Yes  If so, are there any without handrails? No  Home free of loose throw rugs in walkways, pet beds, electrical cords, etc? Yes  Adequate lighting in your home to reduce risk of falls? Yes   ASSISTIVE DEVICES UTILIZED TO PREVENT FALLS:  Life alert? No  Use of a cane, walker or w/c? No  Grab bars in the bathroom? Yes  Shower chair or bench in shower? No  Elevated toilet seat or a handicapped toilet? No   TIMED UP AND GO:  Was the test performed? No . Telephonic visit.   Cognitive Function: Normal cognitive status assessed by direct observation by this Nurse Health Advisor. No abnormalities found.          Immunizations Immunization History  Administered Date(s) Administered   Tdap 08/05/2015    TDAP status: Up to date  Flu Vaccine status: Due, Education has been provided regarding the importance of this vaccine. Advised may receive this vaccine at local pharmacy or Health Dept. Aware to provide a copy of the vaccination record if obtained from local pharmacy or Health Dept. Verbalized acceptance and understanding.  Pneumococcal vaccine status: Due, Education has been provided regarding the  importance of this vaccine. Advised may receive this vaccine at local pharmacy or Health Dept. Aware to provide a copy of the vaccination record if obtained from local pharmacy or Health Dept. Verbalized acceptance and understanding.  Covid-19 vaccine status: Completed vaccines - pt advised to bring a copy of vaccine record to her next appt  Qualifies for Shingles Vaccine? No   due age 25  Screening Tests Health Maintenance  Topic Date Due   COVID-19 Vaccine (1) Never done   Pneumococcal Vaccine 36-27 Years old (1 - PCV) Never done   CHLAMYDIA SCREENING  10/06/2018   INFLUENZA VACCINE  04/26/2021 (Originally 08/27/2020)   HPV VACCINES (1 - 2-dose series) 06/21/2021 (Originally 10/04/2007)   PAP-Cervical Cytology Screening  06/22/2023   PAP SMEAR-Modifier  06/22/2023   TETANUS/TDAP  08/04/2025   Hepatitis C Screening  Completed   HIV Screening  Completed    Health Maintenance  Health Maintenance Due  Topic Date Due   COVID-19 Vaccine (1) Never done   Pneumococcal Vaccine 4-54 Years old (1 - PCV) Never done   CHLAMYDIA SCREENING  10/06/2018    Colorectal cancer status: due age 44  Mammogram status: due age 38  Bone density status: due age 6  Lung Cancer Screening: (Low Dose CT Chest recommended if Age 31-80 years, 30 pack-year currently smoking OR have quit w/in 15years.) does not qualify.   Additional Screening:  Hepatitis C Screening: does qualify; Completed 06/21/20  Vision Screening: Recommended annual ophthalmology exams for early detection of glaucoma and other disorders of the eye. Is the patient up to date with their annual eye exam?  No  Who is the provider or what is the name of the office in which the patient attends annual eye exams? Not established If pt is not established with a provider, would they like to be referred to a provider to establish care? No .   Dental Screening: Recommended annual dental exams for proper oral hygiene  Community Resource Referral  / Chronic Care Management: CRR required this visit?  No   CCM required this visit?  No      Plan:     I have personally reviewed and noted the following in the patients chart:   Medical and social history Use of alcohol, tobacco or illicit drugs  Current medications and supplements including opioid prescriptions. Patient is not currently taking opioid prescriptions. Functional ability and status Nutritional status Physical activity Advanced directives List of other physicians Hospitalizations, surgeries, and ER visits in previous 12 months Vitals Screenings to include cognitive, depression, and falls Referrals and appointments  In addition, I have reviewed and discussed with patient certain preventive protocols, quality metrics, and best practice recommendations. A written personalized care plan for preventive services as well as general preventive health recommendations were provided to patient.     Clemetine Marker, LPN   0/02/5850   Nurse Notes: pt states she was recently exposed to Covid from a coworker. Pt c/o congestion, coughing, sweating & overall not feeling well. Symptoms started yesterday. Plans to take OTC covid test and contact office if sxs worsen or persist. Pt states she also has shortness of breath but also attributes it to smoking. Pt also feels like she might pass out at different times and unsure if it is anxiety related; pt feels most anxious when sun is going down. Pt plans to contact psychiatrist to discuss. PHQ9 score of 11 today; difficulty concentrating and does not have a regular sleep schedule. Pt also c/o abnormal uterine bleeding due to occasional irregular periods; plans to discuss at next visit and track periods.

## 2021-04-01 NOTE — Patient Instructions (Signed)
Jenna Pratt , Thank you for taking time to come for your Medicare Wellness Visit. I appreciate your ongoing commitment to your health goals. Please review the following plan we discussed and let me know if I can assist you in the future.   Screening recommendations/referrals: Colonoscopy: n/a Mammogram: n/a Bone Density: n/a Recommended yearly ophthalmology/optometry visit for glaucoma screening and checkup Recommended yearly dental visit for hygiene and checkup  Vaccinations: Influenza vaccine: due Pneumococcal vaccine: due Tdap vaccine: done 08/05/15 Shingles vaccine: due age 25  Covid-19: please bring a copy of your vaccine record to your next appt.   Conditions/risks identified: If you wish to quit smoking, help is available. For free tobacco cessation program offerings call the [facility] at [phone] or Live Well Line at (985)550-9670. You may also visit [email or web address] or email [email or web address] for more information on other programs.   Next appointment: Follow up in one year for your annual wellness visit.   Preventive Care 20-64 Years, Female Preventive care refers to lifestyle choices and visits with your health care provider that can promote health and wellness. What does preventive care include? A yearly physical exam. This is also called an annual well check. Dental exams once or twice a year. Routine eye exams. Ask your health care provider how often you should have your eyes checked. Personal lifestyle choices, including: Daily care of your teeth and gums. Regular physical activity. Eating a healthy diet. Avoiding tobacco and drug use. Limiting alcohol use. Practicing safe sex. Taking low-dose aspirin daily starting at age 70. Taking vitamin and mineral supplements as recommended by your health care provider. What happens during an annual well check? The services and screenings done by your health care provider during your annual  well check will depend on your age, overall health, lifestyle risk factors, and family history of disease. Counseling  Your health care provider may ask you questions about your: Alcohol use. Tobacco use. Drug use. Emotional well-being. Home and relationship well-being. Sexual activity. Eating habits. Work and work Statistician. Method of birth control. Menstrual cycle. Pregnancy history. Screening  You may have the following tests or measurements: Height, weight, and BMI. Blood pressure. Lipid and cholesterol levels. These may be checked every 5 years, or more frequently if you are over 33 years old. Skin check. Lung cancer screening. You may have this screening every year starting at age 39 if you have a 30-pack-year history of smoking and currently smoke or have quit within the past 15 years. Fecal occult blood test (FOBT) of the stool. You may have this test every year starting at age 55. Flexible sigmoidoscopy or colonoscopy. You may have a sigmoidoscopy every 5 years or a colonoscopy every 10 years starting at age 43. Hepatitis C blood test. Hepatitis B blood test. Sexually transmitted disease (STD) testing. Diabetes screening. This is done by checking your blood sugar (glucose) after you have not eaten for a while (fasting). You may have this done every 1-3 years. Mammogram. This may be done every 1-2 years. Talk to your health care provider about when you should start having regular mammograms. This may depend on whether you have a family history of breast cancer. BRCA-related cancer screening. This may be done if you have a family history of breast, ovarian, tubal, or peritoneal cancers. Pelvic exam and Pap test. This may be done every 3 years starting at age 31. Starting at age 58, this may be done every 5 years if you have a Pap test  in combination with an HPV test. Bone density scan. This is done to screen for osteoporosis. You may have this scan if you are at high risk for  osteoporosis. Discuss your test results, treatment options, and if necessary, the need for more tests with your health care provider. Vaccines  Your health care provider may recommend certain vaccines, such as: Influenza vaccine. This is recommended every year. Tetanus, diphtheria, and acellular pertussis (Tdap, Td) vaccine. You may need a Td booster every 10 years. Zoster vaccine. You may need this after age 19. Pneumococcal 13-valent conjugate (PCV13) vaccine. You may need this if you have certain conditions and were not previously vaccinated. Pneumococcal polysaccharide (PPSV23) vaccine. You may need one or two doses if you smoke cigarettes or if you have certain conditions. Talk to your health care provider about which screenings and vaccines you need and how often you need them. This information is not intended to replace advice given to you by your health care provider. Make sure you discuss any questions you have with your health care provider. Document Released: 02/09/2015 Document Revised: 10/03/2015 Document Reviewed: 11/14/2014 Elsevier Interactive Patient Education  2017 [employer] Prevention in the Home Falls can cause injuries. They can happen to people of all ages. There are many things you can do to make your home safe and to help prevent falls. What can I do on the outside of my home? Regularly fix the edges of walkways and driveways and fix any cracks. Remove anything that might make you trip as you walk through a door, such as a raised step or threshold. Trim any bushes or trees on the path to your home. Use bright outdoor lighting. Clear any walking paths of anything that might make someone trip, such as rocks or tools. Regularly check to see if handrails are loose or broken. Make sure that both sides of any steps have handrails. Any raised decks and porches should have guardrails on the edges. Have any leaves, snow, or ice cleared regularly. Use sand or  salt on walking paths during winter. Clean up any spills in your garage right away. This includes oil or grease spills. What can I do in the bathroom? Use night lights. Install grab bars by the toilet and in the tub and shower. Do not use towel bars as grab bars. Use non-skid mats or decals in the tub or shower. If you need to sit down in the shower, use a plastic, non-slip stool. Keep the floor dry. Clean up any water that spills on the floor as soon as it happens. Remove soap buildup in the tub or shower regularly. Attach bath mats securely with double-sided non-slip rug tape. Do not have throw rugs and other things on the floor that can make you trip. What can I do in the bedroom? Use night lights. Make sure that you have a light by your bed that is easy to reach. Do not use any sheets or blankets that are too big for your bed. They should not hang down onto the floor. Have a firm chair that has side arms. You can use this for support while you get dressed. Do not have throw rugs and other things on the floor that can make you trip. What can I do in the kitchen? Clean up any spills right away. Avoid walking on wet floors. Keep items that you use a lot in easy-to-reach places. If you need to reach something above you, use a strong step  stool that has a grab bar. Keep electrical cords out of the way. Do not use floor polish or wax that makes floors slippery. If you must use wax, use non-skid floor wax. Do not have throw rugs and other things on the floor that can make you trip. What can I do with my stairs? Do not leave any items on the stairs. Make sure that there are handrails on both sides of the stairs and use them. Fix handrails that are broken or loose. Make sure that handrails are as long as the stairways. Check any carpeting to make sure that it is firmly attached to the stairs. Fix any carpet that is loose or worn. Avoid having throw rugs at the top or bottom of the stairs. If  you do have throw rugs, attach them to the floor with carpet tape. Make sure that you have a light switch at the top of the stairs and the bottom of the stairs. If you do not have them, ask someone to add them for you. What else can I do to help prevent falls? Wear shoes that: Do not have high heels. Have rubber bottoms. Are comfortable and fit you well. Are closed at the toe. Do not wear sandals. If you use a stepladder: Make sure that it is fully opened. Do not climb a closed stepladder. Make sure that both sides of the stepladder are locked into place. Ask someone to hold it for you, if possible. Clearly mark and make sure that you can see: Any grab bars or handrails. First and last steps. Where the edge of each step is. Use tools that help you move around (mobility aids) if they are needed. These include: Canes. Walkers. Scooters. Crutches. Turn on the lights when you go into a dark area. Replace any light bulbs as soon as they burn out. Set up your furniture so you have a clear path. Avoid moving your furniture around. If any of your floors are uneven, fix them. If there are any pets around you, be aware of where they are. Review your medicines with your doctor. Some medicines can make you feel dizzy. This can increase your chance of falling. Ask your doctor what other things that you can do to help prevent falls. This information is not intended to replace advice given to you by your health care provider. Make sure you discuss any questions you have with your health care provider. Document Released: 11/09/2008 Document Revised: 06/21/2015 Document Reviewed: 02/17/2014 Elsevier Interactive Patient Education  2017 [employer].

## 2021-04-24 ENCOUNTER — Other Ambulatory Visit: Payer: Self-pay

## 2021-04-24 ENCOUNTER — Emergency Department
Admission: EM | Admit: 2021-04-24 | Discharge: 2021-04-24 | Disposition: A | Discharge status: Home / Self Care | Payer: Medicare Other | Attending: Emergency Medicine | Admitting: Emergency Medicine

## 2021-04-24 DIAGNOSIS — R220 Localized swelling, mass and lump, head: Secondary | ICD-10-CM | POA: Diagnosis present

## 2021-04-24 DIAGNOSIS — K122 Cellulitis and abscess of mouth: Secondary | ICD-10-CM | POA: Insufficient documentation

## 2021-04-24 MED ORDER — OXYCODONE HCL 5 MG PO TABS: 5.0000 mg | ORAL_TABLET | Freq: Once | ORAL | Status: DC

## 2021-04-24 MED ORDER — DEXAMETHASONE SODIUM PHOSPHATE 10 MG/ML IJ SOLN
6.0000 mg | Freq: Once | INTRAMUSCULAR | Status: AC
  Administered 2021-04-24: 6 mg via INTRAMUSCULAR
  Filled 2021-04-24: qty 1

## 2021-04-24 MED ORDER — OXYCODONE HCL 5 MG PO TABS: 5.0000 mg | ORAL_TABLET | Freq: Three times a day (TID) | ORAL | 0 refills | Status: AC | PRN

## 2021-04-24 NOTE — ED Notes (Signed)
Pt presents to ED with c/o of swollen lips due to a "popping a pimple'. Pt states it has been getting worse for about 2 weeks.  ?

## 2021-04-24 NOTE — ED Notes (Signed)
D/C and reasons to return discussed pt. Pt educated on not to drive while taking narcotics, oxy not given due to pt needing to drive home. Reasons to return also discussed with pt. Pt ambulatory with steady gait on D/C.  ?

## 2021-04-24 NOTE — ED Provider Notes (Signed)
? ?[facility] ?Provider Note ? ? ? Event Date/Time  ? First MD Initiated Contact with Patient 04/24/21 1458   ?  (approximate) ? ? ?History  ? ?Abscess ? ? ?HPI ? ?Jenna Pratt is a 26 y.o. female who presents to the ED for evaluation of lip swelling and pain ?  ?Patient presents to the ED with her mother for evaluation of lower lip swelling and pain.  She reports picking at a zit on her vermilion border on the lower right 2 days ago, and it "blowing up" hours after this and has been progressively swelling, painful since then.  She was seen at urgent care earlier this morning and was provided a prescription for Augmentin and provided intramuscular Toradol.  She reports taking 1 dose of the Augmentin so far but she was told to come to the ED anyways for evaluation. ? ? ?Physical Exam  ? ?Triage Vital Signs: ?ED Triage Vitals [04/24/21 1302]  ?Enc Vitals Group  ?   BP (!) 117/95  ?   Pulse Rate (!) 106  ?   Resp 18  ?   Temp 98 ?F (36.7 ?C)  ?   Temp src   ?   SpO2 100 %  ?   Weight   ?   Height   ?   Head Circumference   ?   Peak Flow   ?   Pain Score 10  ?   Pain Loc   ?   Pain Edu?   ?   Excl. in [place]?   ? ? ?Most recent vital signs: ?Vitals:  ? 04/24/21 1302  ?BP: (!) 117/95  ?Pulse: (!) 106  ?Resp: 18  ?Temp: 98 ?F (36.7 ?C)  ?SpO2: 100%  ? ? ?General: Awake, no distress.  Tearful and uncomfortable.  With redirection and support, she calms down and is pleasant and interacting appropriately.  Mandibular lip is obviously swollen diffusely, right > left.  To the lower right margin, is a small scab that she was picking at, no purulence and I am unable to express anything.  Palpation of the lip, it is indurated diffusely without any focal features or fluctuance to suggest underlying abscess. ?Intraorally, she has no signs of periapical abscesses, no fullness beneath the tongue or signs of upper airway obstruction.  She is phonating with a normal voice.  No signs of intraoral pathology, only  the lip swelling externally.  Full range of motion of the neck without fullness beneath the chin to suggest deep space infection. ?Bilateral TMs are clear. ?CV:  Good peripheral perfusion.  ?Resp:  Normal effort.  ?Abd:  No distention.  ?MSK:  No deformity noted.  ?Neuro:  No focal deficits appreciated. ?Other:   ? ? ? ? ? ?ED Results / Procedures / Treatments  ? ?Labs ?(all labs ordered are listed, but only abnormal results are displayed) ?Labs Reviewed - No data to display ? ?EKG ? ? ?RADIOLOGY ? ? ?Official radiology report(s): ?No results found. ? ?PROCEDURES and INTERVENTIONS: ? ?Procedures ? ?Medications  ?oxyCODONE (Oxy IR/ROXICODONE) immediate release tablet 5 mg (has no administration in time range)  ?dexamethasone (DECADRON) injection 6 mg (6 mg Intramuscular Given 04/24/21 1528)  ? ? ? ?IMPRESSION / MDM / ASSESSMENT AND PLAN / ED COURSE  ?I reviewed the triage vital signs and the nursing notes. ? ?Young woman presents to the ED with diffuse lower lip swelling with evidence of cellulitis without discrete abscess to require drainage and suitable  for outpatient management with continue antibiotics.  She is tearful and uncomfortable, but does calm down and participates with examination.  Diffuse lower lip swelling and I cannot palpate any fluctuance to suggest underlying abscess require drainage.  She is already started on Augmentin and I think this is an appropriate antibiotic choice.  She is mostly coming in for pain and symptomatic nature of this a few hours after starting the antibiotic course.  I provide her with Decadron, oxycodone and a prescription for a couple more tablets of oxycodone.  We discussed multimodal management at home with ice, NSAIDs and Tylenol as well.  We discussed return precautions. ? ?  ? ? ?FINAL CLINICAL IMPRESSION(S) / ED DIAGNOSES  ? ?Final diagnoses:  ?Lip swelling  ?Cellulitis of mouth  ? ? ? ?Rx / DC Orders  ? ?ED Discharge Orders   ? ?      Ordered  ?  oxyCODONE (ROXICODONE)  5 MG immediate release tablet  Every 8 hours PRN       ? 04/24/21 1526  ? ?  ?  ? ?  ? ? ? ?Note:  This document was prepared using Dragon voice recognition software and may include unintentional dictation errors. ?  ?Vladimir Crofts, MD ?04/24/21 1544 ? ?

## 2021-04-24 NOTE — ED Triage Notes (Signed)
Pt comes with c/o abscess inside mouth. Pt states infection from broken tooth. Pt states this started two weeks ago. Pt has swelling noted to lips. Pt is on meds currently for  this. ?

## 2021-04-24 NOTE — Discharge Instructions (Signed)
Please take Tylenol and ibuprofen/Advil for your pain.  It is safe to take them together, or to alternate them every few hours.  Take up to '1000mg'$  of Tylenol at a time, up to 4 times per day.  Do not take more than 4000 mg of Tylenol in 24 hours.  For ibuprofen, take 400-600 mg, 4-5 times per day. ? ?Please make sure you take the Augmentin antibiotic twice daily every day and finish the whole course. ? ?Use ice on the area. ?

## 2021-04-24 NOTE — ED Notes (Signed)
MD at bedside evaluating pt's R ear due to pain at this time, pt states HX of ear infections.  ?

## 2021-06-26 ENCOUNTER — Encounter: Payer: Medicare Other | Admitting: Internal Medicine

## 2022-02-12 IMAGING — MR MR HEAD WO/W CM
17 of 28 series · 29 of 48 positions shown · IV contrast (5ml Gadavist)
Comparison: Head CT October 23, 2017.

CLINICAL DATA: Headache disorder.

EXAM:
MRI HEAD WITHOUT AND WITH CONTRAST
TECHNIQUE: Multiplanar, multiecho pulse sequences of the brain and surrounding
structures were obtained without and with intravenous contrast.
CONTRAST:  5mL GADAVIST GADOBUTROL 1 MMOL/ML IV SOLN

[Series 9: T1 · sagittal · 5.0mm · 0.62mm/px · 1 of 21 slices shown (1 of 2)]
[im 1/21]
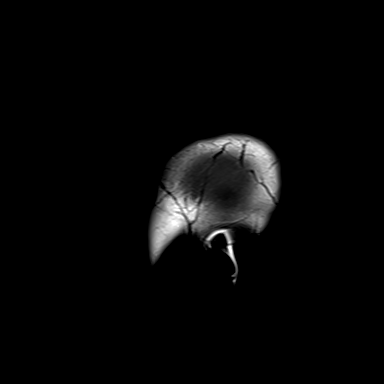

[Series 10: T2 · axial · 5.0mm · 0.53mm/px · 1 of 25 slices shown (1 of 3)]
[im 1/25]
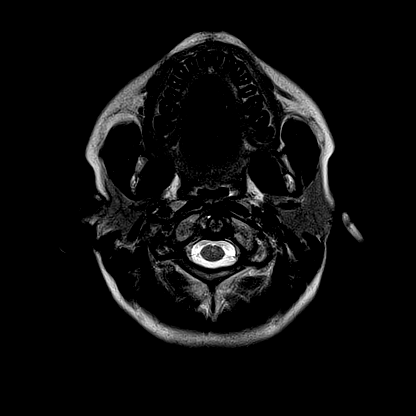

[Series 16: FLAIR · axial · 3.0mm · 0.53mm/px · z∈[-156,-0]mm · 3 of 55 slices shown]
[im 1/55]
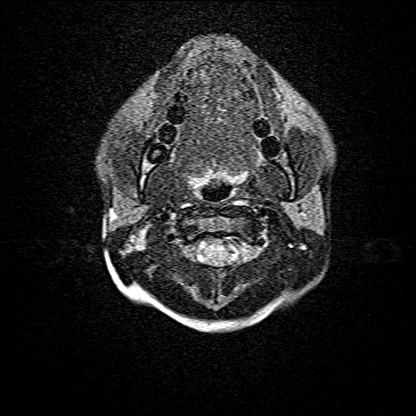
[im 28/55]
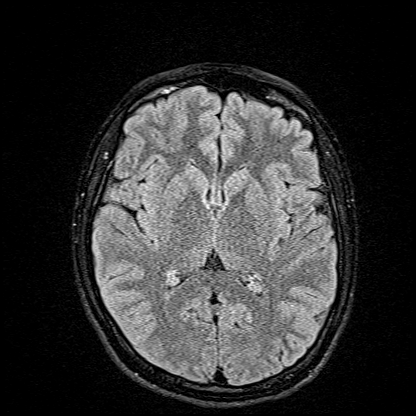
[im 55/55]
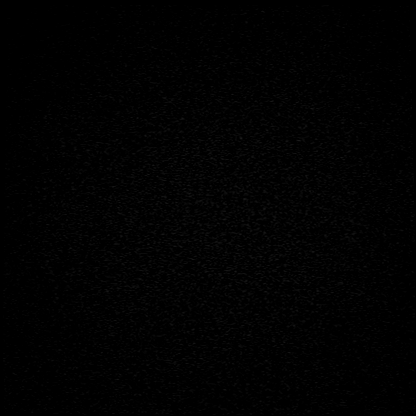

[Series 17: T1 · axial · 1.0mm · 0.98mm/px · z∈[-167,-94]mm · 4 of 176 slices shown (2 of 2)]
[im 1/176]
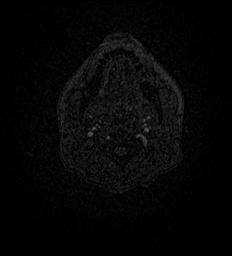
[im 26/176]
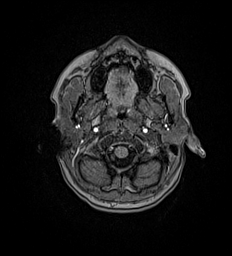
[im 51/176]
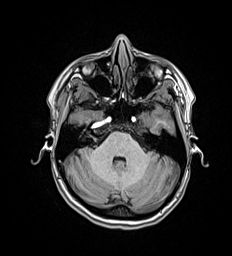
[im 76/176]
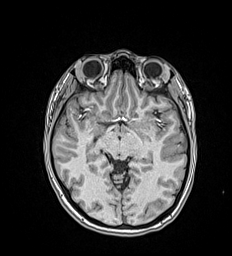

[Series 21: T2 · coronal · 3.0mm · 0.42mm/px · 1 of 13 slices shown (2 of 3)]
[im 1/13]
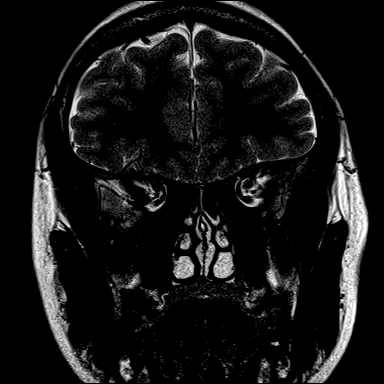

[Series 23: T2 · coronal · 5.0mm · 0.57mm/px · 1 of 29 slices shown (3 of 3)]
[im 1/29]
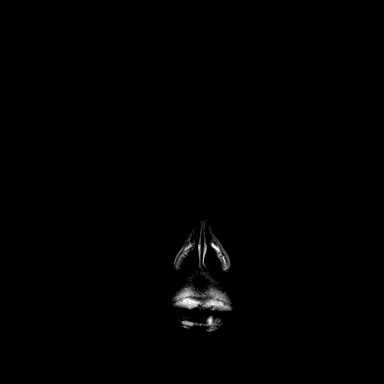

[Series 24: T1 post-contrast · coronal · 3.0mm · 0.28mm/px · 1 of 11 slices shown (1 of 11)]
[im 1/11]
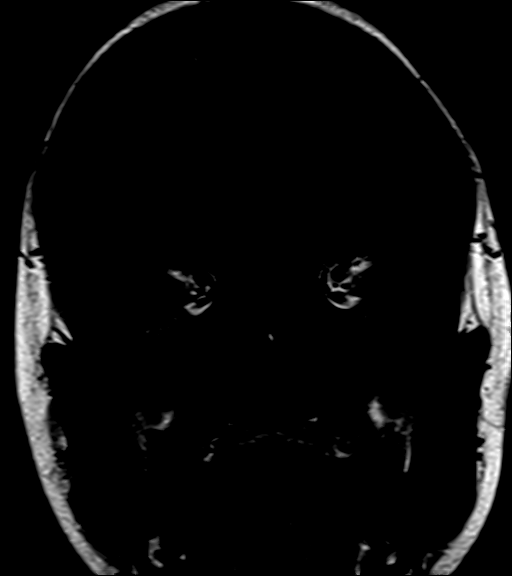

[Series 25: T1 post-contrast · coronal · 3.0mm · 0.28mm/px · 1 of 11 slices shown (2 of 11)]
[im 1/11]
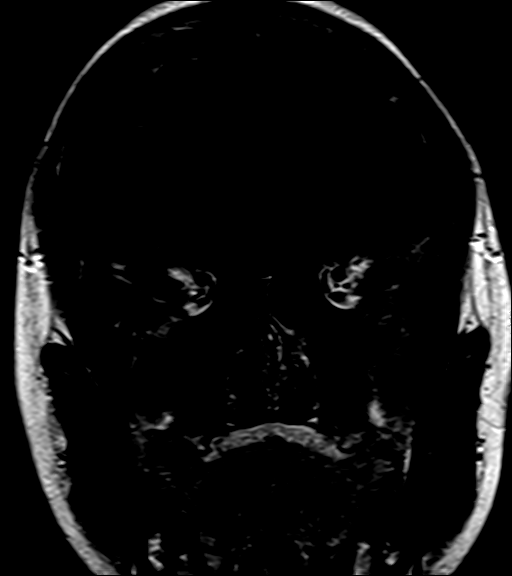

[Series 26: T1 post-contrast · coronal · 3.0mm · 0.28mm/px · 1 of 11 slices shown (3 of 11)]
[im 1/11]
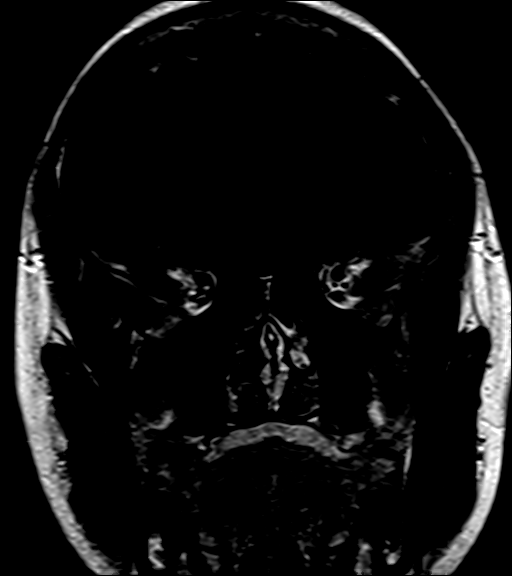

[Series 27: T1 post-contrast · coronal · 3.0mm · 0.28mm/px · 1 of 11 slices shown (4 of 11)]
[im 1/11]
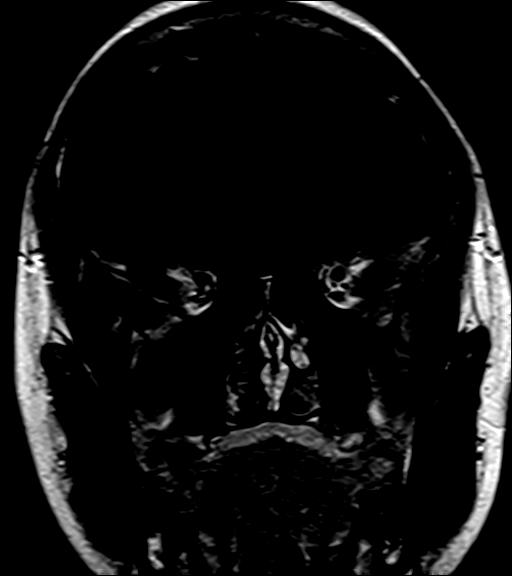

[Series 28: T1 post-contrast · coronal · 3.0mm · 0.28mm/px · 1 of 11 slices shown (5 of 11)]
[im 1/11]
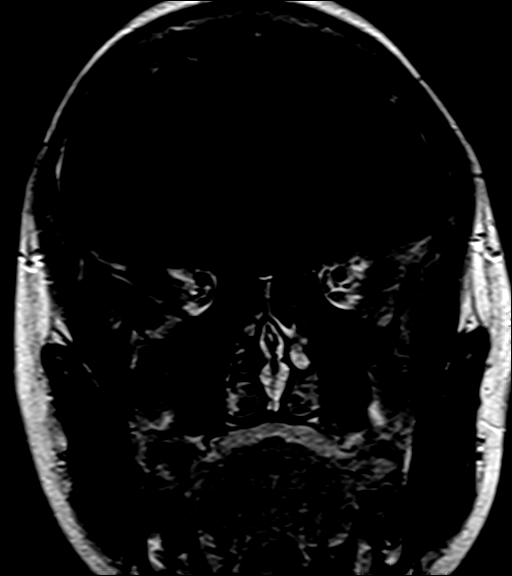

[Series 29: T1 post-contrast · coronal · 3.0mm · 0.28mm/px · 1 of 11 slices shown (6 of 11)]
[im 1/11]
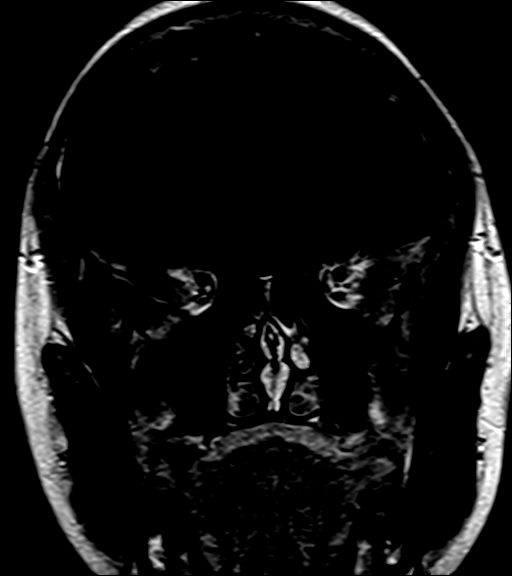

[Series 30: T1 post-contrast · coronal · 3.0mm · 0.21mm/px · 1 of 13 slices shown (7 of 11)]
[im 1/13]
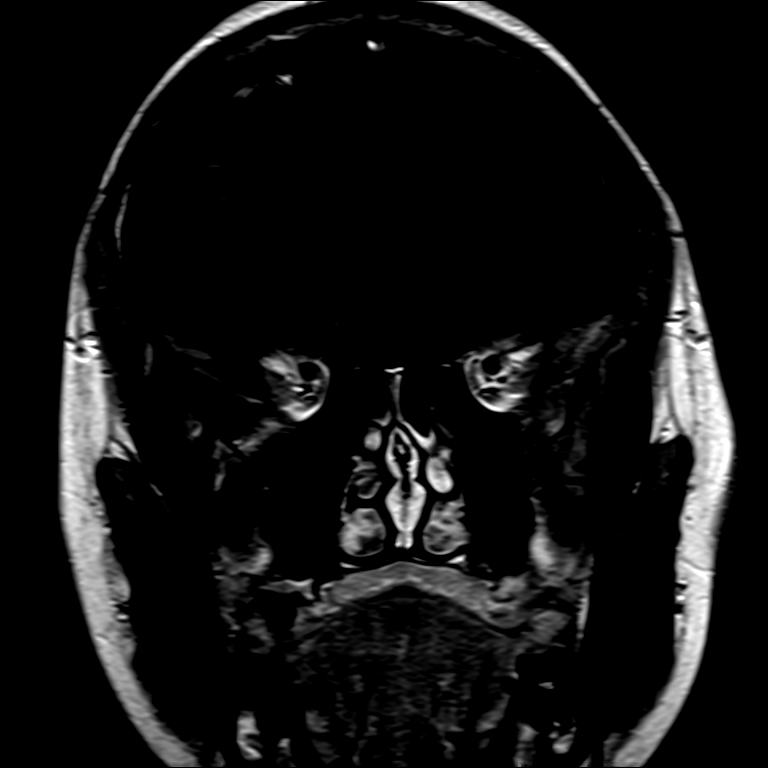

[Series 31: T1 post-contrast · sagittal · 3.0mm · 0.21mm/px · 1 of 13 slices shown (8 of 11)]
[im 1/13]
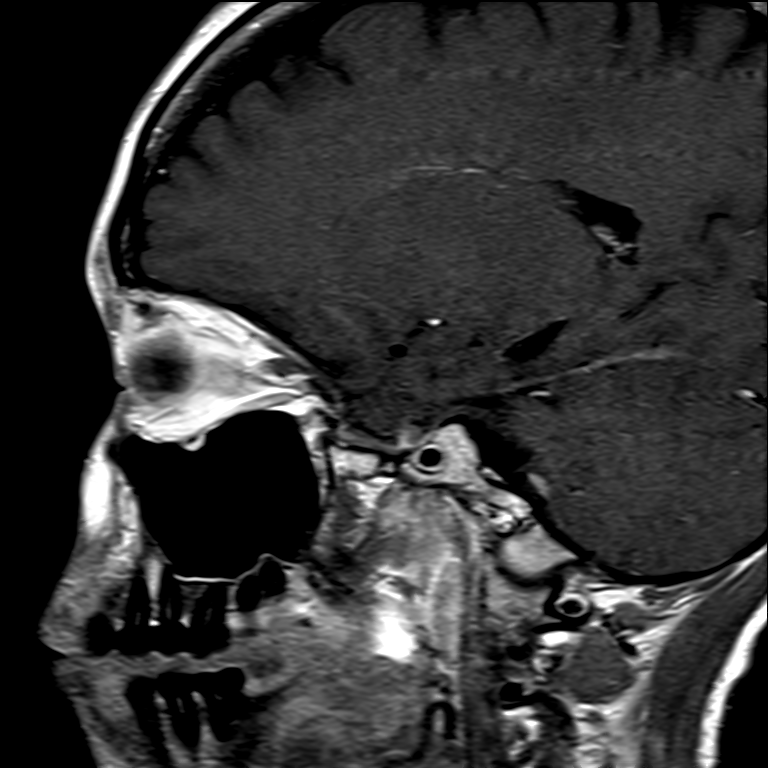

[Series 32: T1 post-contrast · axial · 1.0mm · 0.98mm/px · z∈[-167,+2]mm · 8 of 176 slices shown (9 of 11)]
[im 1/176]
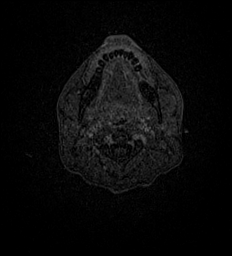
[im 26/176]
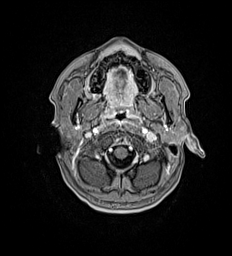
[im 51/176]
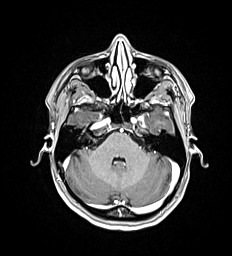
[im 76/176]
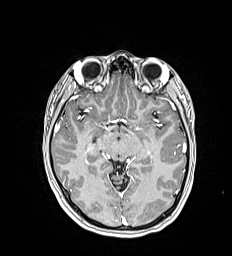
[im 101/176]
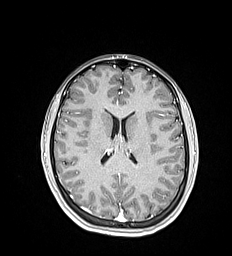
[im 126/176]
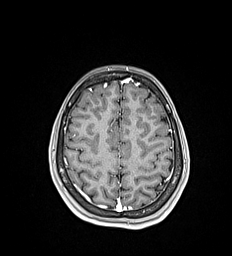
[im 151/176]
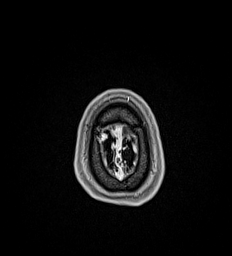
[im 176/176]
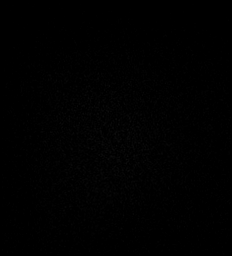

[Series 33: T1 post-contrast · coronal · 5.0mm · 0.57mm/px · 1 of 29 slices shown (10 of 11)]
[im 1/29]
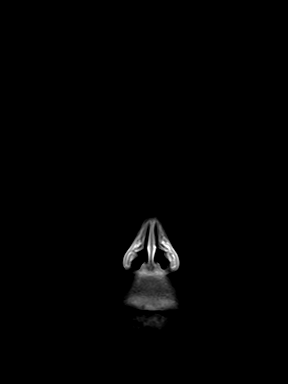

[Series 34: T1 post-contrast · sagittal · 5.0mm · 0.62mm/px · 1 of 21 slices shown (11 of 11)]
[im 1/21]
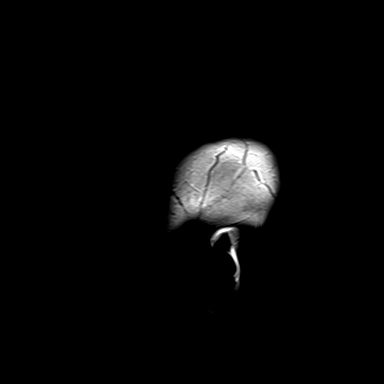

[29 of 48 positions shown; findings below may reference images not displayed]

FINDINGS: Brain: No acute infarction, hemorrhage, hydrocephalus, extra-axial
collection.

Pituitary/Sella: A 3 mm hypoenhancing lesion is seen on the right
side of the pituitary gland on the dynamic contrast phase (series
28, image 6) causing mild bulging of the superior pituitary contour
on the right. A normal posterior pituitary bright spot is seen. The
infundibulum is midline. The hypothalamus and mamillary bodies are
normal. There is no mass effect on the optic chiasm or optic nerves.
The infundibular and chiasmatic recesses are clear. Normal cavernous
sinus and cavernous internal carotid artery flow voids.

Vascular: Normal flow voids.

Skull and upper cervical spine: Normal marrow signal.

Sinuses/Orbits: Negative.

Other: None.
IMPRESSION: A 3 mm hypoenhancing focus on the right side of the pituitary gland
most consistent with a micro adenoma. Otherwise, unremarkable MRI of
the brain.
